# Patient Record
Sex: Female | Born: 1950 | Race: White | Hispanic: No | Marital: Single | Smoking: Former smoker
Health system: Southern US, Community
[De-identification: ages and names within clinical notes are randomized; demographics above are authoritative.]

## PROBLEM LIST (undated history)

## (undated) DIAGNOSIS — E785 Hyperlipidemia, unspecified: Secondary | ICD-10-CM

## (undated) DIAGNOSIS — F32A Depression, unspecified: Secondary | ICD-10-CM

## (undated) DIAGNOSIS — I639 Cerebral infarction, unspecified: Secondary | ICD-10-CM

## (undated) DIAGNOSIS — I10 Essential (primary) hypertension: Secondary | ICD-10-CM

## (undated) DIAGNOSIS — F0150 Vascular dementia without behavioral disturbance: Secondary | ICD-10-CM

## (undated) DIAGNOSIS — F329 Major depressive disorder, single episode, unspecified: Secondary | ICD-10-CM

## (undated) DIAGNOSIS — H3530 Unspecified macular degeneration: Secondary | ICD-10-CM

---

## 2009-12-22 ENCOUNTER — Emergency Department: Payer: Self-pay | Admitting: Emergency Medicine

## 2010-02-20 ENCOUNTER — Emergency Department: Payer: Self-pay

## 2010-03-30 ENCOUNTER — Ambulatory Visit: Payer: Self-pay | Admitting: Psychiatry

## 2010-09-21 ENCOUNTER — Emergency Department: Payer: Self-pay | Admitting: Emergency Medicine

## 2012-01-03 ENCOUNTER — Emergency Department: Payer: Self-pay | Admitting: Emergency Medicine

## 2012-01-03 LAB — URINALYSIS, COMPLETE
Blood: NEGATIVE
Ketone: NEGATIVE
Nitrite: POSITIVE
Ph: 6 (ref 4.5–8.0)
Protein: NEGATIVE
Specific Gravity: 1.017 (ref 1.003–1.030)
Squamous Epithelial: 6

## 2015-08-18 ENCOUNTER — Encounter: Payer: Self-pay | Admitting: Emergency Medicine

## 2015-08-18 ENCOUNTER — Emergency Department: Payer: Medicare Other

## 2015-08-18 ENCOUNTER — Emergency Department
Admission: EM | Admit: 2015-08-18 | Discharge: 2015-08-18 | Disposition: A | Discharge status: Home / Self Care | Payer: Medicare Other | Attending: Emergency Medicine | Admitting: Emergency Medicine

## 2015-08-18 DIAGNOSIS — Z7982 Long term (current) use of aspirin: Secondary | ICD-10-CM | POA: Insufficient documentation

## 2015-08-18 DIAGNOSIS — N179 Acute kidney failure, unspecified: Secondary | ICD-10-CM | POA: Diagnosis not present

## 2015-08-18 DIAGNOSIS — Z720 Tobacco use: Secondary | ICD-10-CM | POA: Diagnosis not present

## 2015-08-18 DIAGNOSIS — N390 Urinary tract infection, site not specified: Secondary | ICD-10-CM | POA: Diagnosis not present

## 2015-08-18 DIAGNOSIS — Z79899 Other long term (current) drug therapy: Secondary | ICD-10-CM | POA: Insufficient documentation

## 2015-08-18 DIAGNOSIS — M546 Pain in thoracic spine: Secondary | ICD-10-CM | POA: Diagnosis present

## 2015-08-18 LAB — CBC WITH DIFFERENTIAL/PLATELET
BASOS ABS: 0 10*3/uL (ref 0–0.1)
BASOS PCT: 0 %
EOS ABS: 0 10*3/uL (ref 0–0.7)
EOS PCT: 0 %
HCT: 41.2 % (ref 35.0–47.0)
Hemoglobin: 13.3 g/dL (ref 12.0–16.0)
Lymphocytes Relative: 6 %
Lymphs Abs: 0.8 10*3/uL — ABNORMAL LOW (ref 1.0–3.6)
MCH: 26.9 pg (ref 26.0–34.0)
MCHC: 32.2 g/dL (ref 32.0–36.0)
MCV: 83.5 fL (ref 80.0–100.0)
MONO ABS: 0.3 10*3/uL (ref 0.2–0.9)
Monocytes Relative: 2 %
Neutro Abs: 11 10*3/uL — ABNORMAL HIGH (ref 1.4–6.5)
Neutrophils Relative %: 92 %
Platelets: 226 10*3/uL (ref 150–440)
RBC: 4.93 MIL/uL (ref 3.80–5.20)
RDW: 14.6 % — ABNORMAL HIGH (ref 11.5–14.5)
WBC: 12.1 10*3/uL — AB (ref 3.6–11.0)

## 2015-08-18 LAB — URINALYSIS COMPLETE WITH MICROSCOPIC (ARMC ONLY)
BILIRUBIN URINE: NEGATIVE
Bacteria, UA: NONE SEEN
Glucose, UA: NEGATIVE mg/dL
HGB URINE DIPSTICK: NEGATIVE
KETONES UR: NEGATIVE mg/dL
NITRITE: POSITIVE — AB
PH: 7 (ref 5.0–8.0)
PROTEIN: NEGATIVE mg/dL
SPECIFIC GRAVITY, URINE: 1.013 (ref 1.005–1.030)

## 2015-08-18 LAB — BASIC METABOLIC PANEL
Anion gap: 13 (ref 5–15)
BUN: 21 mg/dL — AB (ref 6–20)
CALCIUM: 10.4 mg/dL — AB (ref 8.9–10.3)
CO2: 25 mmol/L (ref 22–32)
Chloride: 101 mmol/L (ref 101–111)
Creatinine, Ser: 1.43 mg/dL — ABNORMAL HIGH (ref 0.44–1.00)
GFR calc Af Amer: 43 mL/min — ABNORMAL LOW (ref >60)
GFR, EST NON AFRICAN AMERICAN: 38 mL/min — AB (ref >60)
GLUCOSE: 123 mg/dL — AB (ref 65–99)
Potassium: 3.9 mmol/L (ref 3.5–5.1)
SODIUM: 139 mmol/L (ref 135–145)

## 2015-08-18 MED ORDER — LEVOFLOXACIN IN D5W 750 MG/150ML IV SOLN
750.0000 mg | Freq: Once | INTRAVENOUS | Status: AC
  Administered 2015-08-18: 750 mg via INTRAVENOUS
  Filled 2015-08-18: qty 150

## 2015-08-18 MED ORDER — ONDANSETRON HCL 4 MG/2ML IJ SOLN
4.0000 mg | Freq: Once | INTRAMUSCULAR | Status: AC
  Administered 2015-08-18: 4 mg via INTRAVENOUS
  Filled 2015-08-18: qty 2

## 2015-08-18 MED ORDER — DEXTROSE 5 % IV SOLN
1.0000 g | Freq: Once | INTRAVENOUS | Status: DC
  Filled 2015-08-18: qty 10

## 2015-08-18 MED ORDER — SODIUM CHLORIDE 0.9 % IV BOLUS (SEPSIS)
1000.0000 mL | Freq: Once | INTRAVENOUS | Status: AC
  Administered 2015-08-18: 1000 mL via INTRAVENOUS

## 2015-08-18 MED ORDER — LEVOFLOXACIN 750 MG PO TABS: 750.0000 mg | ORAL_TABLET | Freq: Every day | ORAL | Status: DC

## 2015-08-18 MED ORDER — ACETAMINOPHEN 325 MG PO TABS
650.0000 mg | ORAL_TABLET | Freq: Once | ORAL | Status: AC
  Administered 2015-08-18: 650 mg via ORAL
  Filled 2015-08-18: qty 2

## 2015-08-18 MED ORDER — MORPHINE SULFATE (PF) 4 MG/ML IV SOLN
4.0000 mg | Freq: Once | INTRAVENOUS | Status: AC
  Administered 2015-08-18: 4 mg via INTRAVENOUS
  Filled 2015-08-18: qty 1

## 2015-08-18 MED ORDER — SODIUM CHLORIDE 0.9 % IV BOLUS (SEPSIS): 1000.0000 mL | Freq: Once | INTRAVENOUS | Status: DC

## 2015-08-18 NOTE — Discharge Instructions (Signed)
You were evaluated for back pain and being treated for urinary tract infection and possible early pneumonia with antibiotic called Levaquin. Return to the emergency room for any worsening condition including worsening pain, any trouble breathing or chest pain, any abdominal pain, any urinary problems, any fever, or any condition concerning to you.  You were given IV fluids for mild acute kidney failure suspected to be due to dehydration.   Acute Kidney Injury Acute kidney injury is any condition in which there is sudden (acute) damage to the kidneys. Acute kidney injury was previously known as acute kidney failure or acute renal failure. The kidneys are two organs that lie on either side of the spine between the middle of the back and the front of the abdomen. The kidneys:  Remove wastes and extra water from the blood.   Produce important hormones. These help keep bones strong, regulate blood pressure, and help create red blood cells.   Balance the fluids and chemicals in the blood and tissues. A small amount of kidney damage may not cause problems, but a large amount of damage may make it difficult or impossible for the kidneys to work the way they should. Acute kidney injury may develop into long-lasting (chronic) kidney disease. It may also develop into a life-threatening disease called end-stage kidney disease. Acute kidney injury can get worse very quickly, so it should be treated right away. Early treatment may prevent other kidney diseases from developing. CAUSES   A problem with blood flow to the kidneys. This may be caused by:   Blood loss.   Heart disease.   Severe burns.   Liver disease.  Direct damage to the kidneys. This may be caused by:  Some medicines.   A kidney infection.   Poisoning or consuming toxic substances.   A surgical wound.   A blow to the kidney area.   A problem with urine flow. This may be caused by:   Cancer.   Kidney stones.    An enlarged prostate. SIGNS AND SYMPTOMS   Swelling (edema) of the legs, ankles, or feet.   Tiredness (lethargy).   Nausea or vomiting.   Confusion.   Problems with urination, such as:   Painful or burning feeling during urination.   Decreased urine production.   Frequent accidents in children who are potty trained.   Bloody urine.   Muscle twitches and cramps.   Shortness of breath.   Seizures.   Chest pain or pressure. Sometimes, no symptoms are present. DIAGNOSIS Acute kidney injury may be detected and diagnosed by tests, including blood, urine, imaging, or kidney biopsy tests.  TREATMENT Treatment of acute kidney injury varies depending on the cause and severity of the kidney damage. In mild cases, no treatment may be needed. The kidneys may heal on their own. If acute kidney injury is more severe, your health care provider will treat the cause of the kidney damage, help the kidneys heal, and prevent complications from occurring. Severe cases may require a procedure to remove toxic wastes from the body (dialysis) or surgery to repair kidney damage. Surgery may involve:   Repair of a torn kidney.   Removal of an obstruction. HOME CARE INSTRUCTIONS  Follow your prescribed diet.  Take medicines only as directed by your health care provider.  Do not take any new medicines (prescription, over-the-counter, or nutritional supplements) unless approved by your health care provider. Many medicines can worsen your kidney damage or may need to have the dose adjusted.  Keep all follow-up visits as directed by your health care provider. This is important.  Observe your condition to make sure you are healing as expected. SEEK IMMEDIATE MEDICAL CARE IF:  You are feeling ill or have severe pain in the back or side.   Your symptoms return or you have new symptoms.  You have any symptoms of end-stage kidney disease. These include:   Persistent  itchiness.   Loss of appetite.   Headaches.   Abnormally dark or light skin.  Numbness in the hands or feet.   Easy bruising.   Frequent hiccups.   Menstruation stops.   You have a fever.  You have increased urine production.  You have pain or bleeding when urinating. MAKE SURE YOU:   Understand these instructions.  Will watch your condition.  Will get help right away if you are not doing well or get worse.   This information is not intended to replace advice given to you by your health care provider. Make sure you discuss any questions you have with your health care provider.   Document Released: 04/17/2011 Document Revised: 10/23/2014 Document Reviewed: 05/31/2012 Elsevier Interactive Patient Education 2016 [employer]  Urinary Tract Infection A urinary tract infection (UTI) can occur any place along the urinary tract. The tract includes the kidneys, ureters, bladder, and urethra. A type of germ called bacteria often causes a UTI. UTIs are often helped with antibiotic medicine.  HOME CARE   If given, take antibiotics as told by your doctor. Finish them even if you start to feel better.  Drink enough fluids to keep your pee (urine) clear or pale yellow.  Avoid tea, drinks with caffeine, and bubbly (carbonated) drinks.  Pee often. Avoid holding your pee in for a long time.  Pee before and after having sex (intercourse).  Wipe from front to back after you poop (bowel movement) if you are a woman. Use each tissue only once. GET HELP RIGHT AWAY IF:   You have back pain.  You have lower belly (abdominal) pain.  You have chills.  You feel sick to your stomach (nauseous).  You throw up (vomit).  Your burning or discomfort with peeing does not go away.  You have a fever.  Your symptoms are not better in 3 days. MAKE SURE YOU:   Understand these instructions.  Will watch your condition.  Will get help right away if you are not doing well or  get worse.   This information is not intended to replace advice given to you by your health care provider. Make sure you discuss any questions you have with your health care provider.   Document Released: 03/20/2008 Document Revised: 10/23/2014 Document Reviewed: 05/02/2012 Elsevier Interactive Patient Education [employer].

## 2015-08-18 NOTE — ED Provider Notes (Signed)
[facility] Emergency Department Provider Note   ____________________________________________  Time seen: On arrival to ED I have reviewed the triage vital signs and the triage nursing note.  HISTORY  Chief Complaint Back Pain   Historian Patient, somewhat poor historian  HPI Ana Nielsen is a 64 y.o. female who is here complaining of left mid back pain paraspinous. Patient states she has a history of a pinched nerve, but she is unclear where she had a pinched nerve in the past.  No urinary symptoms. No abdominal pain. No chest pain or trouble breathing. No history of recent fevers. Pain is intermittent. Unclear when it started but, possibly today. No new trauma or injury or fall.    History reviewed. No pertinent past medical history. left-sided stroke with left-sided deficits.  There are no active problems to display for this patient.   History reviewed. No pertinent past surgical history.  Current Outpatient Rx  Name  Route  Sig  Dispense  Refill  . levofloxacin (LEVAQUIN) 750 MG tablet   Oral   Take 1 tablet (750 mg total) by mouth daily.   6 tablet   0    metoprolol, Lipitor, Seroquel, Vicodin, milk of magnesia, Allegra, Zantac, Wellbutrin, aspirin  Allergies Review of patient's allergies indicates not on file.  No family history on file.  Social History Social History  Substance Use Topics  . Smoking status: Current Every Day Smoker -- 0.50 packs/day    Types: Cigarettes  . Smokeless tobacco: None  . Alcohol Use: No    Review of Systems  Constitutional: Negative for fever. Eyes: Negative for visual changes. ENT: Negative for sore throat. Cardiovascular: Negative for chest pain. Respiratory: Negative for shortness of breath. Gastrointestinal: Negative for abdominal pain, vomiting and diarrhea. Genitourinary: Negative for dysuria. Musculoskeletal: Negative for extremity pain. Skin: Negative for rash. Neurological: Negative for  headache. 10 point Review of Systems otherwise negative ____________________________________________   PHYSICAL EXAM:  VITAL SIGNS: ED Triage Vitals  Enc Vitals Group     BP 08/18/15 1618 159/86 mmHg     Pulse Rate 08/18/15 1618 84     Resp 08/18/15 1618 16     Temp 08/18/15 1618 97.7 F (36.5 C)     Temp Source 08/18/15 1618 Oral     SpO2 08/18/15 1618 96 %     Weight 08/18/15 1618 169 lb (76.658 kg)     Height 08/18/15 1618 5\' 4"  (1.626 m)     Head Cir --      Peak Flow --      Pain Score 08/18/15 1618 9     Pain Loc --      Pain Edu? --      Excl. in [place]? --      Constitutional: Alert and oriented, but somewhat poor historian. Well appearing and in no distress. Eyes: Conjunctivae are normal. PERRL. Normal extraocular movements. ENT   Head: Normocephalic and atraumatic.   Nose: No congestion/rhinnorhea.   Mouth/Throat: Mucous membranes are moist.   Neck: No stridor. Cardiovascular/Chest: Normal rate, regular rhythm.  No murmurs, rubs, or gallops. Respiratory: Normal respiratory effort without tachypnea nor retractions. Breath sounds are clear and equal bilaterally. No wheezes/rales/rhonchi. Gastrointestinal: Soft. No distention, no guarding, no rebound. Nontender   Genitourinary/rectal:Deferred Musculoskeletal: No point tenderness along the spine nor paraspinous musculature. No palpable muscle spasm on the back. Nontender with normal range of motion in all extremities. No joint effusions.  No lower extremity tenderness.  No edema. Neurologic:  Normal speech and language. No gross or focal neurologic deficits are appreciated. Skin:  Skin is warm, dry and intact. No rash noted.   ____________________________________________   EKG I, Lisa Roca, MD, the attending physician have personally viewed and interpreted all ECGs.  No EKG performed ____________________________________________  LABS (pertinent positives/negatives)  Basic metabolic panel  significant for BUN 21 and creatinine 1.43, glucose 123 White blood count 12.1 with left shift. Hemoglobin 13.3 and platelet count 226 Urinalysis trace leukocytes, positive nitrites, no bacteria seen ____________________________________________  RADIOLOGY All Xrays were viewed by me. Imaging interpreted by Radiologist.  Thoracic spine two-view: No acute osseous abnormality. 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  Mild RIGHT basilar subsegmental atelectasis. __________________________________________  PROCEDURES  Procedure(s) performed: None  Critical Care performed: None  ____________________________________________   ED COURSE / ASSESSMENT AND PLAN  CONSULTATIONS: None  Pertinent labs & imaging results that were available during my care of the patient were reviewed by me and considered in my medical decision making (see chart for details).  The patient came in with a complaint that she was having a pinched nerve in her back, and referring the spot of the pain to the left side thoracic spine, however there is no point tenderness on palpation of the spine or paraspinous musculature. She seemed to be in quite a bit of pain, and was treated initially with Tylenol, and then some morphine IV. Screening laboratory evaluation did show mildly elevated white blood cell count, and mildly elevated BUN and creatinine. Patient was given IV fluids. I'm currently waiting on urinalysis, out of concern that she could be experiencing pain related to possible kidney stone.  Urinalysis is nitrite positive, and so patient will be treated with IV dose of Levaquin, and a urine culture sent. Levaquin was chosen as it would also cover for pulmonary infiltrate,  as was possibly seen on chest x-ray.  Patient continues to holler intermittently, it is unclear whether or not she is in pain, or if she is trying to have someone sit with her. I considered obtaining a CT angiogram of the chest abdomen pelvis to rule out vascular emergency, however on reexamination she is no longer having any pain, and she states she does not want a CAT scan, and her GFR is 38, and so there would be potential injury to the kidney and I don't think it's necessary or worth the risk at this point in time.   I attempted to call the daughter listed on her paperwork, to update her on her mother's hospital stay, at home and cell phone, and there was no answer.  Patient will be discharged back to her facility, on Levaquin for 1 week.   Patient / Family / Caregiver informed of clinical course, medical decision-making process, and agree with plan.   I discussed return precautions, follow-up instructions, and discharged instructions with patient and/or family.  ___________________________________________   FINAL CLINICAL IMPRESSION(S) / ED DIAGNOSES   Final diagnoses:  Urinary tract infection without hematuria, site unspecified  Acute renal failure, unspecified acute renal failure type (HCC)       Lisa Roca, MD 08/18/15 2054

## 2015-08-18 NOTE — ED Notes (Signed)
Report given to [place] at [facility].

## 2015-08-18 NOTE — ED Notes (Signed)
Pt unable to sign e-signature, no family here to sign.

## 2015-08-18 NOTE — ED Notes (Signed)
Pt comes from [facility] facility with mid back pain. Pt has hx of pinched nerve in mid bad. Pt A&O denies any sob.

## 2015-08-18 NOTE — ED Notes (Signed)
Pt pericare and linen changed.

## 2015-08-20 LAB — URINE CULTURE

## 2015-08-21 ENCOUNTER — Telehealth: Payer: Self-pay | Admitting: Pharmacist

## 2015-08-21 NOTE — Telephone Encounter (Signed)
Called [facility] to call in new RX for pt since ux showed resistance to fluroquinolone. Spoke to [facility] and informed her of the need to call in new RX. Requested that we fax over cx report and RX. Faxed over the cx report and the discharge cx report form with Dr. Corky Downs signature and orders for cephalexin 500mg  BID for 7 days and instructions to d/c levaquin. Faxed to [phone]. Call back # of [phone] was left with Maliq. Ramond Dial, Pharm.D Clinical Pharmacist

## 2017-05-14 ENCOUNTER — Emergency Department: Payer: Medicare Other

## 2017-05-14 ENCOUNTER — Inpatient Hospital Stay
Admission: EM | Admit: 2017-05-14 | Discharge: 2017-06-16 | DRG: 870 | Disposition: E | Payer: Medicare Other | Attending: Internal Medicine | Admitting: Internal Medicine

## 2017-05-14 DIAGNOSIS — A419 Sepsis, unspecified organism: Principal | ICD-10-CM | POA: Diagnosis present

## 2017-05-14 DIAGNOSIS — R6520 Severe sepsis without septic shock: Secondary | ICD-10-CM | POA: Diagnosis not present

## 2017-05-14 DIAGNOSIS — Z7982 Long term (current) use of aspirin: Secondary | ICD-10-CM

## 2017-05-14 DIAGNOSIS — J9601 Acute respiratory failure with hypoxia: Secondary | ICD-10-CM | POA: Diagnosis present

## 2017-05-14 DIAGNOSIS — M21372 Foot drop, left foot: Secondary | ICD-10-CM | POA: Diagnosis present

## 2017-05-14 DIAGNOSIS — N179 Acute kidney failure, unspecified: Secondary | ICD-10-CM

## 2017-05-14 DIAGNOSIS — Z87891 Personal history of nicotine dependence: Secondary | ICD-10-CM

## 2017-05-14 DIAGNOSIS — F329 Major depressive disorder, single episode, unspecified: Secondary | ICD-10-CM | POA: Diagnosis present

## 2017-05-14 DIAGNOSIS — E872 Acidosis, unspecified: Secondary | ICD-10-CM

## 2017-05-14 DIAGNOSIS — B9562 Methicillin resistant Staphylococcus aureus infection as the cause of diseases classified elsewhere: Secondary | ICD-10-CM | POA: Diagnosis present

## 2017-05-14 DIAGNOSIS — R739 Hyperglycemia, unspecified: Secondary | ICD-10-CM | POA: Diagnosis present

## 2017-05-14 DIAGNOSIS — R4020 Unspecified coma: Secondary | ICD-10-CM | POA: Diagnosis present

## 2017-05-14 DIAGNOSIS — C7931 Secondary malignant neoplasm of brain: Secondary | ICD-10-CM | POA: Diagnosis present

## 2017-05-14 DIAGNOSIS — R627 Adult failure to thrive: Secondary | ICD-10-CM | POA: Diagnosis not present

## 2017-05-14 DIAGNOSIS — Z4659 Encounter for fitting and adjustment of other gastrointestinal appliance and device: Secondary | ICD-10-CM

## 2017-05-14 DIAGNOSIS — Y95 Nosocomial condition: Secondary | ICD-10-CM | POA: Diagnosis present

## 2017-05-14 DIAGNOSIS — R1310 Dysphagia, unspecified: Secondary | ICD-10-CM | POA: Diagnosis present

## 2017-05-14 DIAGNOSIS — M21371 Foot drop, right foot: Secondary | ICD-10-CM | POA: Diagnosis present

## 2017-05-14 DIAGNOSIS — F0150 Vascular dementia without behavioral disturbance: Secondary | ICD-10-CM | POA: Diagnosis present

## 2017-05-14 DIAGNOSIS — R4182 Altered mental status, unspecified: Secondary | ICD-10-CM

## 2017-05-14 DIAGNOSIS — I69354 Hemiplegia and hemiparesis following cerebral infarction affecting left non-dominant side: Secondary | ICD-10-CM

## 2017-05-14 DIAGNOSIS — I69391 Dysphagia following cerebral infarction: Secondary | ICD-10-CM

## 2017-05-14 DIAGNOSIS — R34 Anuria and oliguria: Secondary | ICD-10-CM | POA: Diagnosis not present

## 2017-05-14 DIAGNOSIS — H3530 Unspecified macular degeneration: Secondary | ICD-10-CM | POA: Diagnosis present

## 2017-05-14 DIAGNOSIS — E860 Dehydration: Secondary | ICD-10-CM | POA: Diagnosis present

## 2017-05-14 DIAGNOSIS — I10 Essential (primary) hypertension: Secondary | ICD-10-CM | POA: Diagnosis present

## 2017-05-14 DIAGNOSIS — R40243 Glasgow coma scale score 3-8, unspecified time: Secondary | ICD-10-CM | POA: Diagnosis not present

## 2017-05-14 DIAGNOSIS — B961 Klebsiella pneumoniae [K. pneumoniae] as the cause of diseases classified elsewhere: Secondary | ICD-10-CM | POA: Diagnosis present

## 2017-05-14 DIAGNOSIS — D332 Benign neoplasm of brain, unspecified: Secondary | ICD-10-CM | POA: Diagnosis not present

## 2017-05-14 DIAGNOSIS — Z79899 Other long term (current) drug therapy: Secondary | ICD-10-CM

## 2017-05-14 DIAGNOSIS — C799 Secondary malignant neoplasm of unspecified site: Secondary | ICD-10-CM

## 2017-05-14 DIAGNOSIS — R5381 Other malaise: Secondary | ICD-10-CM | POA: Diagnosis present

## 2017-05-14 DIAGNOSIS — R6521 Severe sepsis with septic shock: Secondary | ICD-10-CM | POA: Diagnosis present

## 2017-05-14 DIAGNOSIS — J15212 Pneumonia due to Methicillin resistant Staphylococcus aureus: Secondary | ICD-10-CM | POA: Diagnosis not present

## 2017-05-14 DIAGNOSIS — E785 Hyperlipidemia, unspecified: Secondary | ICD-10-CM | POA: Diagnosis present

## 2017-05-14 DIAGNOSIS — J189 Pneumonia, unspecified organism: Secondary | ICD-10-CM | POA: Diagnosis present

## 2017-05-14 DIAGNOSIS — L8990 Pressure ulcer of unspecified site, unspecified stage: Secondary | ICD-10-CM | POA: Insufficient documentation

## 2017-05-14 DIAGNOSIS — C7951 Secondary malignant neoplasm of bone: Secondary | ICD-10-CM | POA: Diagnosis not present

## 2017-05-14 DIAGNOSIS — D696 Thrombocytopenia, unspecified: Secondary | ICD-10-CM | POA: Diagnosis present

## 2017-05-14 DIAGNOSIS — J9690 Respiratory failure, unspecified, unspecified whether with hypoxia or hypercapnia: Secondary | ICD-10-CM | POA: Diagnosis present

## 2017-05-14 DIAGNOSIS — Z22322 Carrier or suspected carrier of Methicillin resistant Staphylococcus aureus: Secondary | ICD-10-CM

## 2017-05-14 DIAGNOSIS — E870 Hyperosmolality and hypernatremia: Secondary | ICD-10-CM | POA: Diagnosis present

## 2017-05-14 DIAGNOSIS — Z515 Encounter for palliative care: Secondary | ICD-10-CM | POA: Diagnosis not present

## 2017-05-14 DIAGNOSIS — Z66 Do not resuscitate: Secondary | ICD-10-CM | POA: Diagnosis present

## 2017-05-14 DIAGNOSIS — J690 Pneumonitis due to inhalation of food and vomit: Secondary | ICD-10-CM

## 2017-05-14 DIAGNOSIS — Z7189 Other specified counseling: Secondary | ICD-10-CM | POA: Diagnosis not present

## 2017-05-14 DIAGNOSIS — N289 Disorder of kidney and ureter, unspecified: Secondary | ICD-10-CM

## 2017-05-14 DIAGNOSIS — L89150 Pressure ulcer of sacral region, unstageable: Secondary | ICD-10-CM | POA: Diagnosis present

## 2017-05-14 DIAGNOSIS — Z7401 Bed confinement status: Secondary | ICD-10-CM

## 2017-05-14 DIAGNOSIS — N19 Unspecified kidney failure: Secondary | ICD-10-CM

## 2017-05-14 HISTORY — DX: Hyperlipidemia, unspecified: E78.5

## 2017-05-14 HISTORY — DX: Unspecified macular degeneration: H35.30

## 2017-05-14 HISTORY — DX: Cerebral infarction, unspecified: I63.9

## 2017-05-14 HISTORY — DX: Major depressive disorder, single episode, unspecified: F32.9

## 2017-05-14 HISTORY — DX: Depression, unspecified: F32.A

## 2017-05-14 HISTORY — DX: Vascular dementia, unspecified severity, without behavioral disturbance, psychotic disturbance, mood disturbance, and anxiety: F01.50

## 2017-05-14 HISTORY — DX: Essential (primary) hypertension: I10

## 2017-05-14 LAB — COMPREHENSIVE METABOLIC PANEL
ALT: 30 U/L (ref 14–54)
ANION GAP: 14 (ref 5–15)
AST: 99 U/L — AB (ref 15–41)
Albumin: 3.3 g/dL — ABNORMAL LOW (ref 3.5–5.0)
Alkaline Phosphatase: 289 U/L — ABNORMAL HIGH (ref 38–126)
BUN: 90 mg/dL — ABNORMAL HIGH (ref 6–20)
CHLORIDE: 117 mmol/L — AB (ref 101–111)
CO2: 29 mmol/L (ref 22–32)
Calcium: 14.4 mg/dL (ref 8.9–10.3)
Creatinine, Ser: 2.41 mg/dL — ABNORMAL HIGH (ref 0.44–1.00)
GFR, EST AFRICAN AMERICAN: 23 mL/min — AB (ref >60)
GFR, EST NON AFRICAN AMERICAN: 20 mL/min — AB (ref >60)
Glucose, Bld: 156 mg/dL — ABNORMAL HIGH (ref 65–99)
POTASSIUM: 4 mmol/L (ref 3.5–5.1)
Sodium: 160 mmol/L — ABNORMAL HIGH (ref 135–145)
Total Bilirubin: 0.9 mg/dL (ref 0.3–1.2)
Total Protein: 7.5 g/dL (ref 6.5–8.1)

## 2017-05-14 LAB — GLUCOSE, CAPILLARY: Glucose-Capillary: 122 mg/dL — ABNORMAL HIGH (ref 65–99)

## 2017-05-14 LAB — BLOOD GAS, ARTERIAL
ACID-BASE DEFICIT: 1.3 mmol/L (ref 0.0–2.0)
Acid-base deficit: 4.5 mmol/L — ABNORMAL HIGH (ref 0.0–2.0)
BICARBONATE: 21.1 mmol/L (ref 20.0–28.0)
Bicarbonate: 25.3 mmol/L (ref 20.0–28.0)
FIO2: 0.7
FIO2: 1
LHR: 16 {breaths}/min
MECHANICAL RATE: 16
MECHVT: 420 mL
O2 SAT: 92.5 %
O2 Saturation: 95.3 %
PATIENT TEMPERATURE: 37
PATIENT TEMPERATURE: 37
PCO2 ART: 48 mmHg (ref 32.0–48.0)
PEEP: 5 cmH2O
PEEP: 10 cmH2O
PH ART: 7.33 — AB (ref 7.350–7.450)
PO2 ART: 70 mmHg — AB (ref 83.0–108.0)
PO2 ART: 83 mmHg (ref 83.0–108.0)
pCO2 arterial: 40 mmHg (ref 32.0–48.0)
pH, Arterial: 7.33 — ABNORMAL LOW (ref 7.350–7.450)

## 2017-05-14 LAB — URINALYSIS, COMPLETE (UACMP) WITH MICROSCOPIC
GLUCOSE, UA: NEGATIVE mg/dL
Ketones, ur: NEGATIVE mg/dL
NITRITE: POSITIVE — AB
PH: 6 (ref 5.0–8.0)
Specific Gravity, Urine: 1.025 (ref 1.005–1.030)

## 2017-05-14 LAB — CBC WITH DIFFERENTIAL/PLATELET
Basophils Absolute: 0.1 10*3/uL (ref 0–0.1)
Basophils Relative: 1 %
Eosinophils Absolute: 0.1 10*3/uL (ref 0–0.7)
Eosinophils Relative: 0 %
HCT: 39 % (ref 35.0–47.0)
HEMOGLOBIN: 12.2 g/dL (ref 12.0–16.0)
LYMPHS ABS: 2 10*3/uL (ref 1.0–3.6)
LYMPHS PCT: 17 %
MCH: 26.1 pg (ref 26.0–34.0)
MCHC: 31.2 g/dL — ABNORMAL LOW (ref 32.0–36.0)
MCV: 83.6 fL (ref 80.0–100.0)
Monocytes Absolute: 0.7 10*3/uL (ref 0.2–0.9)
Monocytes Relative: 6 %
NEUTROS ABS: 9.5 10*3/uL — AB (ref 1.4–6.5)
NEUTROS PCT: 76 %
Platelets: 205 10*3/uL (ref 150–440)
RBC: 4.67 MIL/uL (ref 3.80–5.20)
RDW: 17.7 % — ABNORMAL HIGH (ref 11.5–14.5)
WBC: 12.3 10*3/uL — ABNORMAL HIGH (ref 3.6–11.0)

## 2017-05-14 LAB — LACTIC ACID, PLASMA
Lactic Acid, Venous: 3.2 mmol/L (ref 0.5–1.9)
Lactic Acid, Venous: 1.6 mmol/L (ref 0.5–1.9)

## 2017-05-14 LAB — BRAIN NATRIURETIC PEPTIDE: B NATRIURETIC PEPTIDE 5: 435 pg/mL — AB (ref 0.0–100.0)

## 2017-05-14 LAB — TROPONIN I: TROPONIN I: 0.15 ng/mL — AB (ref <0.03)

## 2017-05-14 LAB — LIPASE, BLOOD: Lipase: 192 U/L — ABNORMAL HIGH (ref 11–51)

## 2017-05-14 LAB — MRSA PCR SCREENING: MRSA by PCR: POSITIVE — AB

## 2017-05-14 MED ORDER — FAMOTIDINE IN NACL 20-0.9 MG/50ML-% IV SOLN
20.0000 mg | INTRAVENOUS | Status: DC
  Administered 2017-05-14: 20 mg via INTRAVENOUS
  Filled 2017-05-14: qty 50

## 2017-05-14 MED ORDER — MUPIROCIN 2 % EX OINT
1.0000 "application " | TOPICAL_OINTMENT | Freq: Two times a day (BID) | CUTANEOUS | Status: AC
  Administered 2017-05-14 – 2017-05-18 (×10): 1 via NASAL
  Filled 2017-05-14: qty 22

## 2017-05-14 MED ORDER — DOPAMINE-DEXTROSE 3.2-5 MG/ML-% IV SOLN: 2.0000 ug/kg/min | Freq: Once | INTRAVENOUS | Status: DC

## 2017-05-14 MED ORDER — ACETAMINOPHEN 325 MG PO TABS
650.0000 mg | ORAL_TABLET | Freq: Four times a day (QID) | ORAL | Status: DC | PRN
Start: 2017-05-14 — End: 2017-05-23

## 2017-05-14 MED ORDER — HEPARIN SODIUM (PORCINE) 5000 UNIT/ML IJ SOLN
5000.0000 [IU] | Freq: Three times a day (TID) | INTRAMUSCULAR | Status: DC
  Administered 2017-05-14 – 2017-05-15 (×3): 5000 [IU] via SUBCUTANEOUS
  Filled 2017-05-14 (×4): qty 1

## 2017-05-14 MED ORDER — VANCOMYCIN HCL IN DEXTROSE 1-5 GM/200ML-% IV SOLN
1000.0000 mg | Freq: Once | INTRAVENOUS | Status: AC
  Administered 2017-05-14: 1000 mg via INTRAVENOUS
  Filled 2017-05-14: qty 200

## 2017-05-14 MED ORDER — ACETAMINOPHEN 650 MG RE SUPP: 650.0000 mg | Freq: Four times a day (QID) | RECTAL | Status: DC | PRN

## 2017-05-14 MED ORDER — PIPERACILLIN-TAZOBACTAM 3.375 G IVPB 30 MIN
3.3750 g | Freq: Once | INTRAVENOUS | Status: AC
  Administered 2017-05-14: 3.375 g via INTRAVENOUS

## 2017-05-14 MED ORDER — SODIUM CHLORIDE 0.9 % IV SOLN: 25.0000 ug/h | INTRAVENOUS | Status: DC

## 2017-05-14 MED ORDER — ORAL CARE MOUTH RINSE
15.0000 mL | OROMUCOSAL | Status: DC
  Administered 2017-05-14 – 2017-05-20 (×59): 15 mL via OROMUCOSAL

## 2017-05-14 MED ORDER — ACETAMINOPHEN 325 MG PO TABS: 650.0000 mg | ORAL_TABLET | Freq: Four times a day (QID) | ORAL | Status: DC | PRN

## 2017-05-14 MED ORDER — SODIUM CHLORIDE 0.9 % IV BOLUS (SEPSIS)
1000.0000 mL | Freq: Once | INTRAVENOUS | Status: AC
  Administered 2017-05-14: 1000 mL via INTRAVENOUS

## 2017-05-14 MED ORDER — ONDANSETRON HCL 4 MG/2ML IJ SOLN: 4.0000 mg | Freq: Four times a day (QID) | INTRAMUSCULAR | Status: DC | PRN

## 2017-05-14 MED ORDER — DEXTROSE 5 % IV SOLN
1.0000 g | INTRAVENOUS | Status: DC
  Administered 2017-05-14: 1 g via INTRAVENOUS
  Filled 2017-05-14: qty 1

## 2017-05-14 MED ORDER — CHLORHEXIDINE GLUCONATE 0.12% ORAL RINSE (MEDLINE KIT)
15.0000 mL | Freq: Two times a day (BID) | OROMUCOSAL | Status: DC
  Administered 2017-05-14 – 2017-05-22 (×18): 15 mL via OROMUCOSAL

## 2017-05-14 MED ORDER — DEXTROSE 5 % IV SOLN
INTRAVENOUS | Status: DC
  Administered 2017-05-14 – 2017-05-16 (×3): via INTRAVENOUS

## 2017-05-14 MED ORDER — CALCITONIN (SALMON) 200 UNIT/ML IJ SOLN
4.0000 [IU]/kg | Freq: Two times a day (BID) | INTRAMUSCULAR | Status: AC
Start: 2017-05-14 — End: 2017-05-15
  Administered 2017-05-14 – 2017-05-15 (×4): 104 [IU] via INTRAMUSCULAR
  Filled 2017-05-14 (×4): qty 0.52

## 2017-05-14 MED ORDER — PIPERACILLIN-TAZOBACTAM 3.375 G IVPB: 3.3750 g | Freq: Two times a day (BID) | INTRAVENOUS | Status: DC

## 2017-05-14 MED ORDER — DOPAMINE-DEXTROSE 3.2-5 MG/ML-% IV SOLN
INTRAVENOUS | Status: AC
  Filled 2017-05-14: qty 250

## 2017-05-14 MED ORDER — ROCURONIUM BROMIDE 50 MG/5ML IV SOLN
1.0000 mg/kg | Freq: Once | INTRAVENOUS | Status: DC
  Administered 2017-05-14: 05:00:00 via INTRAVENOUS

## 2017-05-14 MED ORDER — CHLORHEXIDINE GLUCONATE CLOTH 2 % EX PADS
6.0000 | MEDICATED_PAD | Freq: Every day | CUTANEOUS | Status: AC
  Administered 2017-05-14 – 2017-05-18 (×5): 6 via TOPICAL

## 2017-05-14 MED ORDER — VANCOMYCIN HCL 500 MG IV SOLR
500.0000 mg | Freq: Once | INTRAVENOUS | Status: AC
  Administered 2017-05-14: 500 mg via INTRAVENOUS
  Filled 2017-05-14: qty 500

## 2017-05-14 MED ORDER — ETOMIDATE 2 MG/ML IV SOLN
15.0000 mg | Freq: Once | INTRAVENOUS | Status: AC
  Administered 2017-05-14: 15 mg via INTRAVENOUS

## 2017-05-14 MED ORDER — SODIUM CHLORIDE 0.9 % IV SOLN: INTRAVENOUS | Status: DC

## 2017-05-14 MED ORDER — FENTANYL 2500MCG IN NS 250ML (10MCG/ML) PREMIX INFUSION
0.0000 ug/h | INTRAVENOUS | Status: DC
  Administered 2017-05-14: 25 ug/h via INTRAVENOUS
  Administered 2017-05-15: 100 ug/h via INTRAVENOUS
  Administered 2017-05-16 – 2017-05-17 (×2): 150 ug/h via INTRAVENOUS
  Administered 2017-05-18: 100 ug/h via INTRAVENOUS
  Administered 2017-05-19: 150 ug/h via INTRAVENOUS
  Filled 2017-05-14 (×8): qty 250

## 2017-05-14 MED ORDER — ONDANSETRON HCL 4 MG PO TABS: 4.0000 mg | ORAL_TABLET | Freq: Four times a day (QID) | ORAL | Status: DC | PRN
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
## 2017-05-14 NOTE — Progress Notes (Signed)
Called to er for pt sats in high 60's. Changed pt to 100% and 10 of peep. Sats increased to mid 70's. Pt taken off vent and bagged with 10 of peep at 0558. Pts sats increased to low 80s. Sats decreased to high 60's again. Sat probe change to different hand. Pt sats in mid 77s. Placed pt back on vent and sats in low 90's. Pt sats continued to increased to 100%.

## 2017-05-14 NOTE — Progress Notes (Signed)
Pharmacy Antibiotic Note  Ana Nielsen is a 66 y.o. female with a h/o CVA admitted on 05/03/2017 with acute respiratory failure.  Pharmacy has been consulted for ceftazidime and vancomycin dosing. MRSA PCR is positive. Patient is in ARF with unknown baseline SCR.   Plan: Vancomycin 1000 mg iv once given. Will give an additional vancomycin 500 mg iv and check a random level in 24 hours. Goal trough of 15-20 mcg/ml.   Ceftazidime 1 g iv q 24 hours.   Height: 5\' 4"  (162.6 cm) Weight: 152 lb 1.9 oz (69 kg) IBW/kg (Calculated) : 54.7  Temp (24hrs), Avg:98 F (36.7 C), Min:94.7 F (34.8 C), Max:98.9 F (37.2 C)   Recent Labs Lab 05/01/2017 0450 05/03/2017 0538 04/24/2017 0901  WBC 12.3*  --   --   CREATININE 2.41*  --   --   LATICACIDVEN  --  3.2* 1.6    Estimated Creatinine Clearance: 21.6 mL/min (A) (by C-G formula based on SCr of 2.41 mg/dL (H)).    No Known Allergies  Antimicrobials this admission: Zosyn 7/30 x 1 vancomycin 7/30 >>  Ceftazidime 7/30 >>  Dose adjustments this admission:   Microbiology results: 7/30 BCx: NGTD 7/30 Sputum: sent  7/30 MRSA PCR: positive  Thank you for allowing pharmacy to be a part of this patient's care.  Napoleon Form 05/15/2017 12:39 PM

## 2017-05-14 NOTE — ED Provider Notes (Signed)
[facility] Emergency Department Provider Note   ____________________________________________   First MD Initiated Contact with Patient 04/15/2017 [phone]     (approximate)  I have reviewed the triage vital signs and the nursing notes.   HISTORY  Chief Complaint Respiratory Distress  Patient Unresponsive and Unable to Answer Questions  HPI Ana Nielsen is a 66 y.o. female who was brought in by ambulance with respiratory distress from [facility] which is her nursing facility. According to EMS the patient was found with oxygen levels in the 60s but they are unable to tell us when the patient was last seen normal, the patient's baseline mental status or any other recent history on the patient. They report that the nurse who was with the patient was unable to disclose any information on the patient as she did not know any information. They did not know if she was febrile had complained of any pain or any vomiting. She was brought into the hospital on CPAP with oxygen saturations in the low 70s. The patient was minimally responsive and moving her right arm non-purposefully.   Past Medical History:  Diagnosis Date  . Depression   . Hyperlipidemia   . Hypertension   . Macular degeneration   . Stroke [facility])    left-sided hemiparesis  . Vascular dementia     Patient Active Problem List   Diagnosis Date Noted  . Respiratory failure ([place]) 05/12/2017    History reviewed. No pertinent surgical history.  Prior to Admission medications   Medication Sig Start Date End Date Taking? Authorizing Provider  aspirin EC 81 MG tablet Take 81 mg by mouth daily.   Yes [provider]  atorvastatin (LIPITOR) 20 MG tablet Take 20 mg by mouth daily.   Yes [provider]  carbamazepine (CARBATROL) 300 MG 12 hr capsule Take 300 mg by mouth 2 (two) times daily.   Yes [provider]  CRANBERRY-VITAMIN C-INULIN PO Take 30 mLs by mouth daily.   Yes  [provider]  docusate sodium (COLACE) 100 MG capsule Take 100 mg by mouth 2 (two) times daily.   Yes [provider]  escitalopram (LEXAPRO) 20 MG tablet Take 20 mg by mouth daily.   Yes [provider]  fexofenadine (ALLEGRA ALLERGY) 60 MG tablet Take 60 mg by mouth daily as needed for allergies or rhinitis.   Yes [provider]  hydrochlorothiazide (HYDRODIURIL) 12.5 MG tablet Take 12.5 mg by mouth daily.   Yes [provider]  HYDROcodone-acetaminophen (NORCO/VICODIN) 5-325 MG tablet Take 1 tablet by mouth every 8 (eight) hours as needed for moderate pain.   Yes [provider]  magnesium hydroxide (MILK OF MAGNESIA) 400 MG/5ML suspension Take 30 mLs by mouth every 12 (twelve) hours as needed for mild constipation.   Yes [provider]  metoprolol tartrate (LOPRESSOR) 25 MG tablet Take 25 mg by mouth 2 (two) times daily.   Yes [provider]  Multiple Vitamin (MULTIVITAMIN) capsule Take 1 capsule by mouth daily.   Yes [provider]  polyethylene glycol (MIRALAX / GLYCOLAX) packet Take 17 g by mouth every 12 (twelve) hours as needed for mild constipation, moderate constipation or severe constipation.   Yes [provider]  QUEtiapine (SEROQUEL) 100 MG tablet Take 150 mg by mouth 2 (two) times daily.   Yes [provider]  ranitidine (ZANTAC) 150 MG tablet Take 150 mg by mouth at bedtime.   Yes [provider]  Allergies Patient has no known allergies.  No family history on file.  Social History Social History  Substance Use Topics  . Smoking status: Former Smoker    Packs/day: 0.50    Types: Cigarettes  . Smokeless tobacco: Not on file  . Alcohol use No    Review of Systems  Unable to assess due to patient unresponsiveness and lack of information from nursing home.  ____________________________________________   PHYSICAL EXAM:  VITAL SIGNS: ED Triage Vitals    Enc Vitals Group     BP 05/03/2017 0452 (!) 125/102     Pulse Rate 05/08/2017 0452 (!) 138     Resp 04/22/2017 0452 (!) 47     Temp 05/05/2017 0525 (!) 94.7 F (34.8 C)     Temp Source 04/21/2017 0750 Core     SpO2 04/18/2017 0452 (!) 64 %     Weight 04/18/2017 0452 56 lb 12.8 oz (25.8 kg)     Height 05/11/2017 0739 5\' 4"  (1.626 m)     Head Circumference --      Peak Flow --      Pain Score --      Pain Loc --      Pain Edu? --      Excl. in [place]? --     Constitutional:Patient unresponsive Eyes: Conjunctivae are normal. PERRL. EOMI. Head: Atraumatic. Nose: No congestion/rhinnorhea. Mouth/Throat: Mucous membranes dry and caked with 10 appearing thick liquid substance Cardiovascular: Tachycardia, regular rhythm. Grossly normal heart sounds.  Good peripheral circulation. Respiratory: Tachypnea with increased respiratory effort.  No retractions. Coarse wet breath sounds throughout all lung fields Gastrointestinal: Soft and nontender. No distention. Positive bowel sounds Musculoskeletal: No lower extremity tenderness nor edema.   Neurologic: Patient nonresponsive. Moving right arm up and down but does not speak or make eye contact. Eyes closed but does not follow commands. Left sided spastic hemiparesis. Skin:  Skin is warm, dry and intact.  Psychiatric: Patient unresponsive..  ____________________________________________   LABS (all labs ordered are listed, but only abnormal results are displayed)  Labs Reviewed  COMPREHENSIVE METABOLIC PANEL - Abnormal; Notable for the following:       Result Value   Sodium 160 (*)    Chloride 117 (*)    Glucose, Bld 156 (*)    BUN 90 (*)    Creatinine, Ser 2.41 (*)    Calcium 14.4 (*)    Albumin 3.3 (*)    AST 99 (*)    Alkaline Phosphatase 289 (*)    GFR calc non Af Amer 20 (*)    GFR calc Af Amer 23 (*)    All other components within normal limits  CBC WITH DIFFERENTIAL/PLATELET - Abnormal; Notable for the following:    WBC 12.3 (*)    MCHC 31.2  (*)    RDW 17.7 (*)    Neutro Abs 9.5 (*)    All other components within normal limits  TROPONIN I - Abnormal; Notable for the following:    Troponin I 0.15 (*)    All other components within normal limits  LACTIC ACID, PLASMA - Abnormal; Notable for the following:    Lactic Acid, Venous 3.2 (*)    All other components within normal limits  URINALYSIS, COMPLETE (UACMP) WITH MICROSCOPIC - Abnormal; Notable for the following:    APPearance HAZY (*)    Hgb urine dipstick MODERATE (*)    Bilirubin Urine SMALL (*)    Protein, ur >300 (*)    Nitrite POSITIVE (*)  Leukocytes, UA MODERATE (*)    Squamous Epithelial / LPF 0-5 (*)    Non Squamous Epithelial PRESENT (*)    Bacteria, UA FEW (*)    All other components within normal limits  BLOOD GAS, ARTERIAL - Abnormal; Notable for the following:    pH, Arterial 7.33 (*)    pO2, Arterial 70 (*)    All other components within normal limits  BLOOD GAS, ARTERIAL - Abnormal; Notable for the following:    pH, Arterial 7.33 (*)    Acid-base deficit 4.5 (*)    All other components within normal limits  LIPASE, BLOOD - Abnormal; Notable for the following:    Lipase 192 (*)    All other components within normal limits  BRAIN NATRIURETIC PEPTIDE - Abnormal; Notable for the following:    B Natriuretic Peptide 435.0 (*)    All other components within normal limits  CULTURE, BLOOD (ROUTINE X 2)  CULTURE, BLOOD (ROUTINE X 2)  MRSA PCR SCREENING  LACTIC ACID, PLASMA  BASIC METABOLIC PANEL  CBC   ____________________________________________  EKG  ED ECG REPORT I, Loney Hering, the attending physician, personally viewed and interpreted this ECG.   Date: 05/13/2017  EKG Time: 450  Rate: 139  Rhythm: sinus tachycardia, RBBB  Axis: left axis deviation  Intervals:right bundle branch block  ST&T Change: ST depressions in lead V3, V4, V5, V6  ____________________________________________  RADIOLOGY  Ct Head Wo Contrast  Result  Date: 05/07/2017 CLINICAL DATA:  Unresponsive.  Hypoxia. EXAM: CT HEAD WITHOUT CONTRAST TECHNIQUE: Contiguous axial images were obtained from the base of the skull through the vertex without intravenous contrast. COMPARISON:  09/21/2010 FINDINGS: Brain: No evidence of acute infarction, hemorrhage, hydrocephalus, extra-axial collection or mass effect. New abnormal soft tissue density in the sella measuring up to 14 mm. Advanced chronic microvascular ischemia with low-density in the deep cerebral white matter and remote lacunar infarcts in the deep gray nuclei and deep white matter tracts. Generalized cerebral atrophy. Vascular: Atherosclerotic calcification. Skull: Multiple new lytic lesions, most notably in the left posterior mastoid and squamosal temporal bone, extending into the parietal bone. There is also a large lesion at the greater wing right sphenoid bone, with adjacent dural thickening in the middle cranial fossa. Sinuses/Orbits: Nasopharyngeal fluid in the setting of intubation. 2 cm long mass in the lateral left orbit, with proptosis and mass effect on the optic nerve. IMPRESSION: 1. No acute intracranial finding. 2. 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 3. 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 4. 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 5. Advanced chronic microvascular ischemic injury that has progressed from 2011. Electronically Signed   By: Monte Fantasia M.D.   On: 05/08/2017 07:04   Dg Chest Portable 1 View  Result Date: 05/10/2017 CLINICAL DATA:  Acute onset of hypoxia.  Intubation. EXAM: PORTABLE CHEST 1 VIEW COMPARISON:  04/19/2017 FINDINGS: Endotracheal tube tip measures 3.6 cm above the carina. Enteric tube tip is off field of view but below the left hemidiaphragm. Improved inspiratory effort since previous study. Mild atelectasis or infiltration again demonstrated in the left lung base. No  blunting of costophrenic angles. No pneumothorax. Calcification of the aorta. IMPRESSION: Improved inspiration since previous study. Persistent infiltration or atelectasis in the left lung base. Appliances appear in satisfactory position. Electronically Signed   By: Lucienne Capers M.D.   On: 04/16/2017 06:35   Dg Chest Portable 1 View  Result Date: 05/13/2017 CLINICAL DATA:  Respiratory distress.  Tube placement. EXAM: PORTABLE CHEST 1 VIEW COMPARISON:  08/18/2015 FINDINGS: Endotracheal tube with tip measuring 3.8 cm above the carina. Enteric tube tip is off the field of view but below the left hemidiaphragm. Shallow inspiration with elevation left hemidiaphragm. Heart size and pulmonary vascularity are normal. Interstitial changes in the lungs likely representing fibrosis and bronchitic changes. Possible superimposed infiltration in the left lung base may represent pneumonia or atelectasis. Calcified and tortuous aorta. No pneumothorax. No pleural effusions. IMPRESSION: Appliances appear in satisfactory position. Shallow inspiration with elevation of left hemidiaphragm and atelectasis or infiltration in the left lung base. Chronic bronchitic changes and fibrosis. Electronically Signed   By: Lucienne Capers M.D.   On: 05/09/2017 05:35    ____________________________________________   PROCEDURES  Procedure(s) performed: please, see procedure note(s).  Procedure Name: Intubation Date/Time: 04/15/2017 4:50 AM Performed by: Loney Hering Pre-anesthesia Checklist: Patient identified Oxygen Delivery Method: Ambu bag Preoxygenation: Pre-oxygenation with 100% oxygen Induction Type: Rapid sequence Ventilation: Two handed mask ventilation required Laryngoscope Size: Glidescope and 4 Tube size: 7.5 mm Number of attempts: 2 Placement Confirmation: ETT inserted through vocal cords under direct vision,  Positive ETCO2 and Breath sounds checked- equal and bilateral Secured at: 21 cm Tube secured  with: ETT holder Difficulty Due To: Difficulty was unanticipated Comments: After the initial intubation the cuff was inflated above the vocal cords. We then reinserted the glide scope and ensured that the ET tube cuff was inserted past the vocal cords.       Critical Care performed: Yes, see critical care note(s)  CRITICAL CARE Performed by: Charlesetta Ivory P   Total critical care time: 75 minutes  Critical care time was exclusive of separately billable procedures and treating other patients.  Critical care was necessary to treat or prevent imminent or life-threatening deterioration.  Critical care was time spent personally by me on the following activities: development of treatment plan with patient and/or surrogate as well as nursing, discussions with consultants, evaluation of patient's response to treatment, examination of patient, obtaining history from patient or surrogate, ordering and performing treatments and interventions, ordering and review of laboratory studies, ordering and review of radiographic studies, pulse oximetry and re-evaluation of patient's condition.  ____________________________________________   INITIAL IMPRESSION / ASSESSMENT AND PLAN / ED COURSE  Pertinent labs & imaging results that were available during my care of the patient were reviewed by me and considered in my medical decision making (see chart for details).  This is a 66 year old female who came from her nursing home with respiratory distress. When the patient arrived she was unresponsive and hypoxic. The decision was made immediately to intubate the patient. We initially placed her on BiPAP as we were getting medications and equipment ready for intubation. The patient did have some low O2 sats and was unable to have O2 sats above 70s prior to intubation. When I intubated the patient there was some thick hand secretions coming from her airway which was suctioned. Once we repositioned the tube we  did give the patient 2 L of normal saline immediately. Her blood pressure was initially low but we did get some improvement with fluids. We decided to call a code sepsis on the patient as she was tachycardic and hypotensive with altered mental status. It also appears that the patient may have aspirated and has some thick secretions coming from her airway. After intubation a small amount of fluid came from the patient's catheter  placement and it appears that the patient has some severe dehydration. She has some acute renal insufficiency as well as hypernatremia and hypercalcemia. The patient did have an episode where she dropped her oxygen saturations into the 60s and we were forced to increase the patient's PEEP to 10 and bagged her until her sats improved. We repeated the ABG as well as the chest x-ray but her sats improved. The patient's blood pressure did also decrease with the oxygen saturation but it improved again with the oxygen. We were going to start the patient on some dopamine but as her blood pressure improved we gave her a third liter of normal saline and decided to monitor her pressure. We performed a CT scan of the patient's head which did not show any acute intracranial hemorrhage. The patient did receive a dose of calcitonin for her hypercalcemia and she will be admitted to the intensive care unit. I contacted the hospitalist for admission.      ED Sepsis - Repeat Assessment   Performed at:    0600  Last Vitals:    Blood pressure 92/66, pulse (!) 108, temperature 97.7 F (36.5 C), temperature source Core (Comment), resp. rate (!) 22, height 5\' 4"  (1.626 m), weight 69 kg (152 lb 1.9 oz), SpO2 96 %.  Heart:                  Tachycardia with no murmurs rubs or gallops  Lungs:     Coarse breath sounds with ventilator  Capillary Refill:   <2 sec  Peripheral Pulse (include location): Radial 2+   Skin (include color):   pale   ____________________________________________   FINAL  CLINICAL IMPRESSION(S) / ED DIAGNOSES  Final diagnoses:  Altered mental status, unspecified altered mental status type  Hypernatremia  Hypercalcemia  Lactic acidosis  Acute renal insufficiency  Uremia  Sepsis, due to unspecified organism (HCC)  Aspiration pneumonia of left lung, unspecified aspiration pneumonia type, unspecified part of lung ([place])      NEW MEDICATIONS STARTED DURING THIS VISIT:  New Prescriptions   No medications on file     Note:  This document was prepared using Dragon voice recognition software and may include unintentional dictation errors.    Loney Hering, MD 04/19/2017 [phone]

## 2017-05-14 NOTE — ED Notes (Signed)
AED pads placed on patient

## 2017-05-14 NOTE — ED Notes (Signed)
Patient placed back on vent, taken off manual ventillation

## 2017-05-14 NOTE — H&P (Signed)
[facility] NAME: Ana Nielsen    MR#:  [id number]  DATE OF BIRTH:  18-Dec-1950  DATE OF ADMISSION:  05/02/2017  PRIMARY CARE PHYSICIAN: Dr Frazier Richards   REQUESTING/REFERRING PHYSICIAN: dr Dahlia Client  CHIEF COMPLAINT:   Respiratory distress HISTORY OF PRESENT ILLNESS:  Ana Nielsen  is a 66 y.o. female with a known history of Vascular dementia left-sided hemiparesis from previous stroke and depression who presents from [facility] due to acute hypoxic respiratory failure. According to EMS patient was found with oxygen level in the 60s but it is unclear as to when patient was last seen with normal oxygen level. She was brought to the ER on a CPAP with oxygen saturations in the 70s. Patient is minimally responsive. She was moving her right arm which is her baseline. She was then intubated in the emergency room for acute hypoxic respiratory failure. She was started on Zosyn for pneumonia. She was found to have severe hypernatremia as well as hypercalcemia. She has been given 4 L of fluids along with calcitonin. CT the head is concerning for metastatic disease. Chest x-ray is concerning for possible rib fracture and left-sided pneumonia.  Case discussed with intensivist.  PAST MEDICAL HISTORY:   Past Medical History:  Diagnosis Date  . Depression   . Hyperlipidemia   . Hypertension   . Macular degeneration   . Stroke [facility])    left-sided hemiparesis  . Vascular dementia     PAST SURGICAL HISTORY:  Unable to obtain as patient is sedated on ventilator  SOCIAL HISTORY:   Social History  Substance Use Topics  . Smoking status: Former Smoker    Packs/day: 0.50    Types: Cigarettes  . Smokeless tobacco: Not on file  . Alcohol use No    FAMILY HISTORY:  Unable to obtain as patient is sedated on ventilator  DRUG ALLERGIES:  No Known Allergies  REVIEW OF SYSTEMS:   Review of Systems  Unable to perform ROS: Intubated     MEDICATIONS AT HOME:   Prior to Admission medications   Medication Sig Start Date End Date Taking? Authorizing Provider  aspirin EC 81 MG tablet Take 81 mg by mouth daily.   Yes [provider]  atorvastatin (LIPITOR) 20 MG tablet Take 20 mg by mouth daily.   Yes [provider]  carbamazepine (CARBATROL) 300 MG 12 hr capsule Take 300 mg by mouth 2 (two) times daily.   Yes [provider]  CRANBERRY-VITAMIN C-INULIN PO Take 30 mLs by mouth daily.   Yes [provider]  docusate sodium (COLACE) 100 MG capsule Take 100 mg by mouth 2 (two) times daily.   Yes [provider]  escitalopram (LEXAPRO) 20 MG tablet Take 20 mg by mouth daily.   Yes [provider]  fexofenadine (ALLEGRA ALLERGY) 60 MG tablet Take 60 mg by mouth daily as needed for allergies or rhinitis.   Yes [provider]  hydrochlorothiazide (HYDRODIURIL) 12.5 MG tablet Take 12.5 mg by mouth daily.   Yes [provider]  HYDROcodone-acetaminophen (NORCO/VICODIN) 5-325 MG tablet Take 1 tablet by mouth every 8 (eight) hours as needed for moderate pain.   Yes [provider]  magnesium hydroxide (MILK OF MAGNESIA) 400 MG/5ML suspension Take 30 mLs by mouth every 12 (twelve) hours as needed for mild constipation.   Yes [provider]  metoprolol tartrate (LOPRESSOR) 25 MG tablet Take 25 mg by mouth 2 (two) times daily.  Yes [provider]  Multiple Vitamin (MULTIVITAMIN) capsule Take 1 capsule by mouth daily.   Yes [provider]  polyethylene glycol (MIRALAX / GLYCOLAX) packet Take 17 g by mouth every 12 (twelve) hours as needed for mild constipation, moderate constipation or severe constipation.   Yes [provider]  QUEtiapine (SEROQUEL) 100 MG tablet Take 150 mg by mouth 2 (two) times daily.   Yes [provider]  ranitidine (ZANTAC) 150 MG tablet Take 150 mg by mouth at bedtime.   Yes [provider]      VITAL SIGNS:  Blood pressure 92/66, pulse (!) 108, temperature 97.7 F (36.5 C), temperature source Core (Comment), resp. rate (!) 22, height 5\' 4"  (1.626 m), weight 69 kg (152 lb 1.9 oz), SpO2 96 %.  PHYSICAL EXAMINATION:   Physical Exam  Constitutional: She is well-developed, well-nourished, and in no distress. No distress.  Intubated and sedated  HENT:  Head: Normocephalic.  Eyes: No scleral icterus.  Neck: Normal range of motion. No JVD present. No tracheal deviation present.  Cardiovascular: Normal rate, regular rhythm and normal heart sounds.  Exam reveals no gallop and no friction rub.   No murmur heard. Pulmonary/Chest: Effort normal. No respiratory distress. She has no wheezes. She has rales. She exhibits no tenderness.  Abdominal: Soft. Bowel sounds are normal. She exhibits no distension and no mass. There is no tenderness. There is no rebound and no guarding.  Musculoskeletal: She exhibits edema and deformity (rib on left side elevated). She exhibits no tenderness (left lower base).  Neurological: She displays abnormal reflex. She exhibits abnormal muscle tone.  Movements right side unintentionally left hand is contracted She has left sided hemiplegia  Sedated on vent  No gag reflexes. ER physician  Skin: Skin is warm. No rash noted. She is not diaphoretic. No erythema.  Psychiatric:  Sedated      LABORATORY PANEL:   CBC  Recent Labs Lab 04/18/2017 0450  WBC 12.3*  HGB 12.2  HCT 39.0  PLT 205   ------------------------------------------------------------------------------------------------------------------  Chemistries   Recent Labs Lab 05/09/2017 0450  NA 160*  K 4.0  CL 117*  CO2 29  GLUCOSE 156*  BUN 90*  CREATININE 2.41*  CALCIUM 14.4*  AST 99*  ALT 30  ALKPHOS 289*  BILITOT 0.9   ------------------------------------------------------------------------------------------------------------------  Cardiac  Enzymes  Recent Labs Lab 05/11/2017 0450  TROPONINI 0.15*   ------------------------------------------------------------------------------------------------------------------  RADIOLOGY:  Ct Head Wo Contrast  Result Date: 05/13/2017 CLINICAL DATA:  Unresponsive.  Hypoxia. EXAM: CT HEAD WITHOUT CONTRAST TECHNIQUE: Contiguous axial images were obtained from the base of the skull through the vertex without intravenous contrast. COMPARISON:  09/21/2010 FINDINGS: Brain: No evidence of acute infarction, hemorrhage, hydrocephalus, extra-axial collection or mass effect. New abnormal soft tissue density in the sella measuring up to 14 mm. Advanced chronic microvascular ischemia with low-density in the deep cerebral white matter and remote lacunar infarcts in the deep gray nuclei and deep white matter tracts. Generalized cerebral atrophy. Vascular: Atherosclerotic calcification. Skull: Multiple new lytic lesions, most notably in the left posterior mastoid and squamosal temporal bone, extending into the parietal bone. There is also a large lesion at the greater wing right sphenoid bone, with adjacent dural thickening in the middle cranial fossa. Sinuses/Orbits: Nasopharyngeal fluid in the setting of intubation. 2 cm long mass in the lateral left orbit, with proptosis and mass effect on the optic nerve. IMPRESSION: 1. No acute intracranial finding. 2. Multiple aggressive bone lesions with left temporoparietal  and right temporal dural invasion, a metastatic pattern. 3. 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 4. 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 5. Advanced chronic microvascular ischemic injury that has progressed from 2011. Electronically Signed   By: Monte Fantasia M.D.   On: 04/23/2017 07:04   Dg Chest Portable 1 View  Result Date: 05/11/2017 CLINICAL DATA:  Acute onset of hypoxia.  Intubation. EXAM: PORTABLE CHEST 1 VIEW COMPARISON:  05/06/2017 FINDINGS: Endotracheal tube tip  measures 3.6 cm above the carina. Enteric tube tip is off field of view but below the left hemidiaphragm. Improved inspiratory effort since previous study. Mild atelectasis or infiltration again demonstrated in the left lung base. No blunting of costophrenic angles. No pneumothorax. Calcification of the aorta. IMPRESSION: Improved inspiration since previous study. Persistent infiltration or atelectasis in the left lung base. Appliances appear in satisfactory position. Electronically Signed   By: Lucienne Capers M.D.   On: 05/13/2017 06:35   Dg Chest Portable 1 View  Result Date: 05/10/2017 CLINICAL DATA:  Respiratory distress.  Tube placement. EXAM: PORTABLE CHEST 1 VIEW COMPARISON:  08/18/2015 FINDINGS: Endotracheal tube with tip measuring 3.8 cm above the carina. Enteric tube tip is off the field of view but below the left hemidiaphragm. Shallow inspiration with elevation left hemidiaphragm. Heart size and pulmonary vascularity are normal. Interstitial changes in the lungs likely representing fibrosis and bronchitic changes. Possible superimposed infiltration in the left lung base may represent pneumonia or atelectasis. Calcified and tortuous aorta. No pneumothorax. No pleural effusions. IMPRESSION: Appliances appear in satisfactory position. Shallow inspiration with elevation of left hemidiaphragm and atelectasis or infiltration in the left lung base. Chronic bronchitic changes and fibrosis. Electronically Signed   By: Lucienne Capers M.D.   On: 05/01/2017 05:35    EKG:   Sinus tachycardia right bundle branch block left axis deviation and ST depression in lateral leads  IMPRESSION AND PLAN:   66 year old female with advanced dementia, left-sided hemiparesis from previous CVA who presents with acute hypoxic respiratory failure and acute encephalopathy.  1. Acute hypoxic respiratory failure with oxygen saturations in the 60s requiring intubation in the setting of left lower lobe  pneumonia/HCAP Continue current vent settings Intensivist consult. I have discussed case with Dr. Jamal Collin  2. Sepsis due to HCAP with hypotension: Patient presents with tachypnea, tachycardia and hypotension Continue Zosyn Follow up on blood cultures Start pressors if needed to keep MAP>70   3. Severe hypernatremia: Patient appears to be volume depleted She has received 4 L normal saline Repeat BMP and consider starting D5 W Follow sodium levels  4. Hypercalcemia: Could be related to dehydration or metastatic disease (possibly long) Treated with calcitonin Repeat calcium  5. Acute kidney injury in the setting of sepsis and acute hypoxic respiratory failure Continue fluids and monitor BMP Nephrology consulted needed  6. History of CVA with left-sided hemiparesis  7. 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 Patient would most benefit from positive care consult. It is likely the patient has metastatic cancer as evident by CT scan of the brain.  All the records are reviewed and case discussed with ED provider and dr simonds.   CODE STATUS: FULL  Critical care TOTAL TIME TAKING CARE OF THIS PATIENT: 50 minutes.    Ana Nielsen M.D on 04/29/2017 at 9:42 AM  Between 7am to 6pm - Pager - [phone]  After 6pm go to [email or web address] - Proofreader  [employer]  [phone]  CC: Primary care physician; Patient, No Pcp Per
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
## 2017-05-14 NOTE — Progress Notes (Signed)
Peep increased to 10 fio2 increased to 100% per Dr Dahlia Client.  ET tube also advanced to 23cm as well per md verbal order.

## 2017-05-14 NOTE — ED Notes (Signed)
Respiratory adjusted tube placement per MD Dahlia Client.  Airway at 23 at the lip

## 2017-05-14 NOTE — ED Notes (Addendum)
MD, respiratory at bedside for patient arrival.   MD intubated patient at 55.  Size 7 1/2 tube. Tube 19 at the lip
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
## 2017-05-14 NOTE — ED Triage Notes (Signed)
Patient from [facility]. Pt hx unknown, other than reported no known allergies. Pt's oxygen saturation in low 60s prior to CPAP. Pt O2 sat increased to 72% on CPAP. Pt responsive to painful stimuli

## 2017-05-14 NOTE — ED Notes (Signed)
ED Provider at bedside. 

## 2017-05-14 NOTE — Consult Note (Signed)
PULMONARY / CRITICAL CARE MEDICINE   Name: Ana Nielsen MRN: [id number] DOB: December 19, 1950    ADMISSION DATE:  05/12/2017   PT PROFILE: 37 F NH resident with severe chronic debilitation and cognitive impairment due to prior right hemispheric CVA brought to [facility] ED on morning of admission for unresponsiveness and hypoxemia. Intubated in ED for same. She received 4 L NS for hypotension  HISTORY OF PRESENT ILLNESS:   Level V caveat  PAST MEDICAL HISTORY :  She  has a past medical history of Depression; Hyperlipidemia; Hypertension; Macular degeneration; Stroke [facility]); and Vascular dementia.  PAST SURGICAL HISTORY: She  has no past surgical history on file.  No Known Allergies  No current facility-administered medications on file prior to encounter.    No current outpatient prescriptions on file prior to encounter.    FAMILY HISTORY:  Her has no family status information on file.    SOCIAL HISTORY: She  reports that she has quit smoking. Her smoking use included Cigarettes. She smoked 0.50 packs per day. She does not have any smokeless tobacco history on file. She reports that she does not drink alcohol.  REVIEW OF SYSTEMS:   Level V caveat  SUBJECTIVE:    VITAL SIGNS: BP 100/64 (BP Location: Right Arm)   Pulse (!) 108   Temp (!) 100.9 F (38.3 C) (Core (Comment))   Resp (!) 24   Ht 5\' 4"  (1.626 m)   Wt 152 lb 1.9 oz (69 kg)   SpO2 97%   BMI 26.11 kg/m   HEMODYNAMICS:    VENTILATOR SETTINGS: Vent Mode: PRVC FiO2 (%):  [70 %-100 %] 100 % Set Rate:  [16 bmp] 16 bmp Vt Set:  [420 mL-500 mL] 500 mL PEEP:  [5 cmH20-10 cmH20] 10 cmH20 Plateau Pressure:  [20 cmH20] 20 cmH20  INTAKE / OUTPUT: No intake/output data recorded.  PHYSICAL EXAMINATION: General: Unresponsive/comatose, chronically ill-appearing, labored ventilatory effort Neuro: PERRLA, EOMI, left upper extremity contractures, bilateral foot drop, no spontaneous movement HEENT: NCAT, sclerae  white Cardiovascular: Mildly tachycardia, regular, no murmurs Chest/Lungs: Old contusion on R anterior chest with bony crepitations Bronchial breath sounds in LLL, no wheezes Abdomen: Soft, no palpable masses, diminished bowel sounds Extremities: Cool, no edema, multiple ecchymoses Skin:  Sacral decubitus pressure wound  LABS:  BMET  Recent Labs Lab 05/12/2017 0450  NA 160*  K 4.0  CL 117*  CO2 29  BUN 90*  CREATININE 2.41*  GLUCOSE 156*    Electrolytes  Recent Labs Lab 04/19/2017 0450  CALCIUM 14.4*    CBC  Recent Labs Lab 05/02/2017 0450  WBC 12.3*  HGB 12.2  HCT 39.0  PLT 205    Coag's No results for input(s): APTT, INR in the last 168 hours.  Sepsis Markers  Recent Labs Lab 05/08/2017 0538 04/30/2017 0901  LATICACIDVEN 3.2* 1.6    ABG  Recent Labs Lab 04/22/2017 0510 04/16/2017 0655  PHART 7.33* 7.33*  PCO2ART 48 40  PO2ART 70* 83    Liver Enzymes  Recent Labs Lab 04/26/2017 0450  AST 99*  ALT 30  ALKPHOS 289*  BILITOT 0.9  ALBUMIN 3.3*    Cardiac Enzymes  Recent Labs Lab 04/25/2017 0450  TROPONINI 0.15*    Glucose  Recent Labs Lab 04/28/2017 0856  GLUCAP 122*    Imaging Ct Head Wo Contrast  Result Date: 05/05/2017 CLINICAL DATA:  Unresponsive.  Hypoxia. EXAM: CT HEAD WITHOUT CONTRAST TECHNIQUE: Contiguous axial images were obtained from the base of the skull through the  vertex without intravenous contrast. COMPARISON:  09/21/2010 FINDINGS: Brain: No evidence of acute infarction, hemorrhage, hydrocephalus, extra-axial collection or mass effect. New abnormal soft tissue density in the sella measuring up to 14 mm. Advanced chronic microvascular ischemia with low-density in the deep cerebral white matter and remote lacunar infarcts in the deep gray nuclei and deep white matter tracts. Generalized cerebral atrophy. Vascular: Atherosclerotic calcification. Skull: Multiple new lytic lesions, most notably in the left posterior mastoid and  squamosal temporal bone, extending into the parietal bone. There is also a large lesion at the greater wing right sphenoid bone, with adjacent dural thickening in the middle cranial fossa. Sinuses/Orbits: Nasopharyngeal fluid in the setting of intubation. 2 cm long mass in the lateral left orbit, with proptosis and mass effect on the optic nerve. IMPRESSION: 1. No acute intracranial finding. 2. 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 3. 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 4. 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 5. Advanced chronic microvascular ischemic injury that has progressed from 2011. Electronically Signed   By: Monte Fantasia M.D.   On: 05/09/2017 07:04   Dg Chest Portable 1 View  Result Date: 05/06/2017 CLINICAL DATA:  Acute onset of hypoxia.  Intubation. EXAM: PORTABLE CHEST 1 VIEW COMPARISON:  04/15/2017 FINDINGS: Endotracheal tube tip measures 3.6 cm above the carina. Enteric tube tip is off field of view but below the left hemidiaphragm. Improved inspiratory effort since previous study. Mild atelectasis or infiltration again demonstrated in the left lung base. No blunting of costophrenic angles. No pneumothorax. Calcification of the aorta. IMPRESSION: Improved inspiration since previous study. Persistent infiltration or atelectasis in the left lung base. Appliances appear in satisfactory position. Electronically Signed   By: Lucienne Capers M.D.   On: 05/01/2017 06:35   Dg Chest Portable 1 View  Result Date: 04/26/2017 CLINICAL DATA:  Respiratory distress.  Tube placement. EXAM: PORTABLE CHEST 1 VIEW COMPARISON:  08/18/2015 FINDINGS: Endotracheal tube with tip measuring 3.8 cm above the carina. Enteric tube tip is off the field of view but below the left hemidiaphragm. Shallow inspiration with elevation left hemidiaphragm. Heart size and pulmonary vascularity are normal. Interstitial changes  in the lungs likely representing fibrosis and bronchitic changes. Possible superimposed infiltration in the left lung base may represent pneumonia or atelectasis. Calcified and tortuous aorta. No pneumothorax. No pleural effusions. IMPRESSION: Appliances appear in satisfactory position. Shallow inspiration with elevation of left hemidiaphragm and atelectasis or infiltration in the left lung base. Chronic bronchitic changes and fibrosis. Electronically Signed   By: Lucienne Capers M.D.   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 P:   Monitor BMET intermittently Monitor I/Os Correct electrolytes as indicated I canceled nephrology consultation - see discussion below  GASTROINTESTINAL A:   No acute issues identified P:   SUP: IV famotidine Consider TF protocol 07/31  HEM/ONC A:   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 She has little further to add to the scant history documented above. She did note that the patient's left eye was not moving normally last week. At baseline, the patient is extremely debilitated both physically and cognitively. She is largely bedbound and often  disoriented. I explained to the patient's daughter that she is extremely critically ill (superimposed on severe chronic debilitation) and likely has a previously undiagnosed malignancy with metastases. Therefore, we agreed to not escalate our current level of care. DNR in event of cardiac arrest, no dialysis, no vasopressors. We agreed that this current level of care would be for no more than a couple or few days depending on patient's response to our efforts. Our priority will be to ensure that the patient is not suffering.   CCM time: 60 mins   The above time includes time spent in consultation with patient and/or family members and reviewing care plan on multidisciplinary rounds  Merton Border, MD PCCM service Mobile [phone] Pager [phone] 04/20/2017, 5:23 PM

## 2017-05-14 NOTE — Care Management (Signed)
This RNCM received call from Juleen Starr at [facility] requesting callback from this [facility] [phone]. She said that she was referred to me by unit clerk in ICU which I confirmed. That was a misunderstanding I've learned and there truly wasn't a need for [facility] to contact this RNCM. Levada Dy wanted admitting diagnosis and requested to know Sodium level of patient. I stated that admitting diagnosis was respiratory failure requiring intubation which is why they sent patient to ED. She understood from someone she talked to today told her that patient's Sodium was low and I explained that it was high however I did not know the number. She states that patient has lived at their facility for many years. She then mentioned that EMS had told staff at [facility] that her Oceanographer) nurse didn't give a very good report. I explained that I was not aware of any other information.  I shared that I appreciated her call and that this information will be shared with them at time of discharge.

## 2017-05-14 NOTE — ED Notes (Signed)
Patient's oxygen saturation dropped to 67%. MD and respiratory informed. MD and respiratory to bedside. Respiratory began manual ventilation at 558

## 2017-05-14 NOTE — Progress Notes (Signed)
[facility] notified of declining BP. Informed Elink nurse that Dr. Alva Garnet told RN after a family meeting that patient was a DNR and to not escalate care including starting pressors. There is not a note in with this plan. Waiting elink RN to talk with [facility] physician and call back.

## 2017-05-14 NOTE — ED Notes (Signed)
Report to ICU. Await respiratory assistance for transfer to ICU

## 2017-05-14 NOTE — Progress Notes (Signed)
Consult cancelled by Dr Alva Garnet. Please re-consult as necessary

## 2017-05-15 ENCOUNTER — Inpatient Hospital Stay: Payer: Medicare Other

## 2017-05-15 DIAGNOSIS — R6520 Severe sepsis without septic shock: Secondary | ICD-10-CM

## 2017-05-15 DIAGNOSIS — A419 Sepsis, unspecified organism: Principal | ICD-10-CM

## 2017-05-15 LAB — BLOOD CULTURE ID PANEL (REFLEXED)
ACINETOBACTER BAUMANNII: NOT DETECTED
CANDIDA ALBICANS: NOT DETECTED
Candida glabrata: NOT DETECTED
Candida krusei: NOT DETECTED
Candida parapsilosis: NOT DETECTED
Candida tropicalis: NOT DETECTED
ENTEROBACTERIACEAE SPECIES: NOT DETECTED
Enterobacter cloacae complex: NOT DETECTED
Enterococcus species: NOT DETECTED
Escherichia coli: NOT DETECTED
HAEMOPHILUS INFLUENZAE: NOT DETECTED
Klebsiella oxytoca: NOT DETECTED
Klebsiella pneumoniae: NOT DETECTED
Listeria monocytogenes: NOT DETECTED
METHICILLIN RESISTANCE: DETECTED — AB
NEISSERIA MENINGITIDIS: NOT DETECTED
PSEUDOMONAS AERUGINOSA: NOT DETECTED
Proteus species: NOT DETECTED
STREPTOCOCCUS PNEUMONIAE: NOT DETECTED
STREPTOCOCCUS PYOGENES: NOT DETECTED
STREPTOCOCCUS SPECIES: NOT DETECTED
Serratia marcescens: NOT DETECTED
Staphylococcus aureus (BCID): NOT DETECTED
Staphylococcus species: DETECTED — AB
Streptococcus agalactiae: NOT DETECTED

## 2017-05-15 LAB — CBC
HCT: 32.6 % — ABNORMAL LOW (ref 35.0–47.0)
Hemoglobin: 10.3 g/dL — ABNORMAL LOW (ref 12.0–16.0)
MCH: 26.6 pg (ref 26.0–34.0)
MCHC: 31.7 g/dL — ABNORMAL LOW (ref 32.0–36.0)
MCV: 83.8 fL (ref 80.0–100.0)
Platelets: 119 10*3/uL — ABNORMAL LOW (ref 150–440)
RBC: 3.89 MIL/uL (ref 3.80–5.20)
RDW: 17.3 % — ABNORMAL HIGH (ref 11.5–14.5)
WBC: 6.5 10*3/uL (ref 3.6–11.0)

## 2017-05-15 LAB — GLUCOSE, CAPILLARY
GLUCOSE-CAPILLARY: 173 mg/dL — AB (ref 65–99)
GLUCOSE-CAPILLARY: 172 mg/dL — AB (ref 65–99)
Glucose-Capillary: 186 mg/dL — ABNORMAL HIGH (ref 65–99)

## 2017-05-15 LAB — VANCOMYCIN, RANDOM: Vancomycin Rm: 30

## 2017-05-15 LAB — PHOSPHORUS: Phosphorus: 5 mg/dL — ABNORMAL HIGH (ref 2.5–4.6)

## 2017-05-15 LAB — BASIC METABOLIC PANEL
Anion gap: 10 (ref 5–15)
BUN: 84 mg/dL — AB (ref 6–20)
CO2: 22 mmol/L (ref 22–32)
CREATININE: 2.8 mg/dL — AB (ref 0.44–1.00)
Calcium: 11.7 mg/dL — ABNORMAL HIGH (ref 8.9–10.3)
Chloride: 118 mmol/L — ABNORMAL HIGH (ref 101–111)
GFR calc Af Amer: 19 mL/min — ABNORMAL LOW (ref >60)
GFR, EST NON AFRICAN AMERICAN: 16 mL/min — AB (ref >60)
Glucose, Bld: 226 mg/dL — ABNORMAL HIGH (ref 65–99)
POTASSIUM: 3.8 mmol/L (ref 3.5–5.1)
SODIUM: 150 mmol/L — AB (ref 135–145)

## 2017-05-15 LAB — MAGNESIUM: Magnesium: 1.8 mg/dL (ref 1.7–2.4)

## 2017-05-15 MED ORDER — FAMOTIDINE 20 MG PO TABS
20.0000 mg | ORAL_TABLET | Freq: Every day | ORAL | Status: DC
  Administered 2017-05-15 – 2017-05-16 (×2): 20 mg via ORAL
  Filled 2017-05-15 (×2): qty 1

## 2017-05-15 MED ORDER — VITAL AF 1.2 CAL PO LIQD
1000.0000 mL | ORAL | Status: DC
  Administered 2017-05-15 – 2017-05-18 (×5): 1000 mL

## 2017-05-15 MED ORDER — STERILE WATER FOR INJECTION IJ SOLN
INTRAMUSCULAR | Status: AC
  Administered 2017-05-15: 10 mL
  Filled 2017-05-15: qty 10

## 2017-05-15 MED ORDER — VITAL AF 1.2 CAL PO LIQD: 1000.0000 mL | ORAL | Status: DC

## 2017-05-15 MED ORDER — HEPARIN SODIUM (PORCINE) 5000 UNIT/ML IJ SOLN
5000.0000 [IU] | Freq: Two times a day (BID) | INTRAMUSCULAR | Status: DC
  Administered 2017-05-15: 5000 [IU] via SUBCUTANEOUS
  Filled 2017-05-15: qty 1

## 2017-05-15 MED ORDER — VITAL HIGH PROTEIN PO LIQD
1000.0000 mL | ORAL | Status: DC
  Administered 2017-05-15: 1000 mL

## 2017-05-15 MED ORDER — FREE WATER
100.0000 mL | Freq: Four times a day (QID) | Status: DC
  Administered 2017-05-15 – 2017-05-16 (×4): 100 mL

## 2017-05-15 MED ORDER — CEFTAZIDIME 1 G IJ SOLR
1.0000 g | Freq: Every day | INTRAMUSCULAR | Status: DC
  Administered 2017-05-15 – 2017-05-16 (×2): 1 g via INTRAMUSCULAR
  Filled 2017-05-15 (×2): qty 1

## 2017-05-15 NOTE — Progress Notes (Addendum)
[place] [facility] [place] NAME: Ana Nielsen    MR#:  [id number]  DATE OF BIRTH:  1951/05/21  SUBJECTIVE:  CHIEF COMPLAINT:  Patient is intubated, CODE STATUS was changed to DO NOT RESUSCITATE by patient's daughter  REVIEW OF SYSTEMS:  Review of systems unobtainable as the patient is intubated  DRUG ALLERGIES:  No Known Allergies  VITALS:  Blood pressure (!) 84/52, pulse (!) 104, temperature 99.1 F (37.3 C), resp. rate (!) 24, height 5\' 4"  (1.626 m), weight 69 kg (152 lb 1.9 oz), SpO2 96 %.  PHYSICAL EXAMINATION:  GENERAL:  66 y.o.-year-old patient lying in the bed with no acute distress.  EYES: Pupils equal, round, reactive to light and accommodation. HEENT: Head atraumatic, normocephalic. Oropharynx With ET tube NECK:  Supple, no jugular venous distention. No thyroid enlargement, no tenderness.  LUNGS: Diminished breath sounds, intubated CARDIOVASCULAR: S1, S2 normal. No murmurs, rubs, or gallops.  ABDOMEN: Soft, nontender, nondistended.  EXTREMITIES: No pedal edema, cyanosis, or clubbing.  NEUROLOGIC: Currently intubated and has an chronic dementia  PSYCHIATRIC: The patient is alert and oriented x 3.  SKIN: No obvious rash, lesion, or ulcer.    LABORATORY PANEL:   CBC  Recent Labs Lab 05/15/17 0338  WBC 6.5  HGB 10.3*  HCT 32.6*  PLT 119*   ------------------------------------------------------------------------------------------------------------------  Chemistries   Recent Labs Lab 04/23/2017 0450 05/15/17 0338  NA 160* 150*  K 4.0 3.8  CL 117* 118*  CO2 29 22  GLUCOSE 156* 226*  BUN 90* 84*  CREATININE 2.41* 2.80*  CALCIUM 14.4* 11.7*  MG  --  1.8  AST 99*  --   ALT 30  --   ALKPHOS 289*  --   BILITOT 0.9  --    ------------------------------------------------------------------------------------------------------------------  Cardiac Enzymes  Recent Labs Lab 05/13/2017 0450  TROPONINI 0.15*    ------------------------------------------------------------------------------------------------------------------  RADIOLOGY:  Ct Head Wo Contrast  Result Date: 04/20/2017 CLINICAL DATA:  Unresponsive.  Hypoxia. EXAM: CT HEAD WITHOUT CONTRAST TECHNIQUE: Contiguous axial images were obtained from the base of the skull through the vertex without intravenous contrast. COMPARISON:  09/21/2010 FINDINGS: Brain: No evidence of acute infarction, hemorrhage, hydrocephalus, extra-axial collection or mass effect. New abnormal soft tissue density in the sella measuring up to 14 mm. Advanced chronic microvascular ischemia with low-density in the deep cerebral white matter and remote lacunar infarcts in the deep gray nuclei and deep white matter tracts. Generalized cerebral atrophy. Vascular: Atherosclerotic calcification. Skull: Multiple new lytic lesions, most notably in the left posterior mastoid and squamosal temporal bone, extending into the parietal bone. There is also a large lesion at the greater wing right sphenoid bone, with adjacent dural thickening in the middle cranial fossa. Sinuses/Orbits: Nasopharyngeal fluid in the setting of intubation. 2 cm long mass in the lateral left orbit, with proptosis and mass effect on the optic nerve. IMPRESSION: 1. No acute intracranial finding. 2. 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 3. 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 4. 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 5. Advanced chronic microvascular ischemic injury that has progressed from 2011. Electronically Signed   By: Monte Fantasia M.D.   On: 04/15/2017 07:04   Dg Chest Port 1 View  Result Date: 05/15/2017 CLINICAL DATA:  Respiratory failure. EXAM: PORTABLE CHEST 1 VIEW COMPARISON:  04/22/2017 FINDINGS: Endotracheal tube tip measures 3.5 cm above the carina. Enteric tube tip is off the  field of view but below  the left hemidiaphragm. Shallow inspiration. Heart size and pulmonary vascularity are normal. Since the previous study, there is developing nodular airspace infiltration in the left mid and lower lungs and in the right lung base. This suggest developing pneumonia. No blunting of costophrenic angles. No pneumothorax. Calcified aorta. Old bilateral rib fractures. IMPRESSION: Appliances appear in satisfactory position. Developing nodular infiltrates in the left mid lung and both lower lungs. Changes suggest developing pneumonia. Electronically Signed   By: Lucienne Capers M.D.   On: 05/15/2017 03:47   Dg Chest Portable 1 View  Result Date: 04/30/2017 CLINICAL DATA:  Acute onset of hypoxia.  Intubation. EXAM: PORTABLE CHEST 1 VIEW COMPARISON:  05/06/2017 FINDINGS: Endotracheal tube tip measures 3.6 cm above the carina. Enteric tube tip is off field of view but below the left hemidiaphragm. Improved inspiratory effort since previous study. Mild atelectasis or infiltration again demonstrated in the left lung base. No blunting of costophrenic angles. No pneumothorax. Calcification of the aorta. IMPRESSION: Improved inspiration since previous study. Persistent infiltration or atelectasis in the left lung base. Appliances appear in satisfactory position. Electronically Signed   By: Lucienne Capers M.D.   On: 04/30/2017 06:35   Dg Chest Portable 1 View  Result Date: 04/28/2017 CLINICAL DATA:  Respiratory distress.  Tube placement. EXAM: PORTABLE CHEST 1 VIEW COMPARISON:  08/18/2015 FINDINGS: Endotracheal tube with tip measuring 3.8 cm above the carina. Enteric tube tip is off the field of view but below the left hemidiaphragm. Shallow inspiration with elevation left hemidiaphragm. Heart size and pulmonary vascularity are normal. Interstitial changes in the lungs likely representing fibrosis and bronchitic changes. Possible superimposed infiltration in the left lung base may represent pneumonia or atelectasis.  Calcified and tortuous aorta. No pneumothorax. No pleural effusions. IMPRESSION: Appliances appear in satisfactory position. Shallow inspiration with elevation of left hemidiaphragm and atelectasis or infiltration in the left lung base. Chronic bronchitic changes and fibrosis. Electronically Signed   By: Lucienne Capers M.D.   On: 05/02/2017 05:35    EKG:   Orders placed or performed in visit on 01/03/12  . EKG 12-Lead    ASSESSMENT AND PLAN:    66 year old female with advanced dementia, left-sided hemiparesis from previous CVA who presents with acute hypoxic respiratory failure and acute encephalopathy.  1. Acute hypoxic respiratory failure with oxygen saturations in the 60s requiring intubation in the setting of left lower lobe pneumonia/HCAP Vent management by intensivist Patient's CODE STATUS was changed to DO NOT RESUSCITATE Intensivist consult. I have discussed case with Dr. Jamal Collin  2. Sepsis due to HCAP with hypotension: Patient presents with tachypnea, tachycardia and hypotension Continue Fortaz and vancomycin Follow up on blood cultures no growth so far Start pressors if needed to keep MAP>70   3. Severe hypernatremia: Secondary to volume depletion She has received 4 L normal saline in the emergency department currently on D5W Repeat BMP  Follow sodium levels 160-150  4. Hypercalcemia: Could be related to dehydration and paraneoplastic  Treated with calcitonin   Calcium 14.4-11.7    5. Acute kidney injury in the setting of sepsis and acute hypoxic respiratory failure Continue fluids and monitor BMP Nephrology consult as needed  6. History of CVA with left-sided hemiparesis  7. 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  patient's daughter might consider comfort care measures if no clinical improvement   CODE  STATUS: DNR  TOTAL TIME TAKING CARE OF THIS PATIENT: 26 minutes.   POSSIBLE D/C IN 2 DAYS, DEPENDING ON CLINICAL CONDITION.  Note: This dictation was prepared with Dragon dictation along with smaller phrase technology. Any transcriptional errors that result from this process are unintentional.   Nicholes Mango M.D on 05/15/2017 at 11:48 AM  Between 7am to 6pm - Pager - [phone] After 6pm go to [email or web address] - password [email or web address] [place]  Office  [phone]  CC: Primary care physician; Patient, No Pcp Per

## 2017-05-15 NOTE — Progress Notes (Signed)
Nutrition Follow Up Note   DOCUMENTATION CODES:   Not applicable  INTERVENTION:   Vital AF 1.2 via OGT @ goal rate of 26ml/hr  Free water flushes 121ml q 6 hrs  Regimen provides 1728kcal/day, 108g/day protein, 1515ml free water   NUTRITION DIAGNOSIS:   Inadequate oral intake related to inability to eat as evidenced by NPO status  GOAL:   Provide needs based on ASPEN/SCCM guidelines  MONITOR:   Vent status, Labs, Weight trends, TF tolerance, I & O's  ASSESSMENT:   66 yo female with PMH of depression, HTN, Stroke, Vascular dementia, transferred from [facility] on CPAP, now intubated   Pt continues to be ventilated. Pt with OGT on tube feeds; tolerating well. Pt with new brain mass and possible metastasis. Pt is anuric today. Per MD, pt possibly to transition to comfort care.   Medications reviewed and include: calcitonin, ceftazidime, pepcid, fentanyl heparin  Labs reviewed: Na 150(H), Cl 118(H), BUN 84(H), creat 2.80(H), Ca 11.7(H), P 5.0(H), Mg 1.8 wnl BNP 435(H) cbgs- 156, 226 x 24 hrs   Patient is currently intubated on ventilator support MV: 8.7 L/min Temp (24hrs), Avg:100.1 F (37.8 C), Min:98.1 F (36.7 C), Max:100.9 F (38.3 C)  MAP- 48-69  Propofol: none   Diet Order:   NPO  Skin:  Reviewed, no issues  Last BM:  PTA  Height:   Ht Readings from Last 1 Encounters:  05/08/2017 5\' 4"  (1.626 m)    Weight:   Wt Readings from Last 1 Encounters:  05/11/2017 152 lb 1.9 oz (69 kg)    Ideal Body Weight:  54.54 kg  BMI:  Body mass index is 26.11 kg/m.  Estimated Nutritional Needs:   Kcal:  1654kcal/day   Protein:  97-111 grams (1.4-1.6g/kg)   Fluid:  Per MD/NP/PA  EDUCATION NEEDS:   Education needs no appropriate at this time  Koleen Distance MS, RD, [place] Pager #[phone] After Hours Pager: [phone]

## 2017-05-15 NOTE — Progress Notes (Signed)
Pharmacy Antibiotic Note  Ana Nielsen is a 65 y.o. female with a h/o CVA admitted on 04/24/2017 with acute respiratory failure.  Pharmacy has been consulted for ceftazidime and vancomycin dosing. MRSA PCR is positive. Patient is in ARF with unknown baseline SCR.   Plan: Vancomycin 1000 mg iv once given. Will give an additional vancomycin 500 mg iv and check a random level in 24 hours. Goal trough of 15-20 mcg/ml.   7/31 @ 0338 VR 30. Will continue to hold doses of vanc until levels < 20 mcg/mL. Patient has a BCID showing MRSA, will f/u w/ next VR @ 1200 and assess levels then.  Ceftazidime 1 g iv q 24 hours.   Height: 5\' 4"  (162.6 cm) Weight: 152 lb 1.9 oz (69 kg) IBW/kg (Calculated) : 54.7  Temp (24hrs), Avg:99.5 F (37.5 C), Min:97.2 F (36.2 C), Max:100.9 F (38.3 C)   Recent Labs Lab 04/29/2017 0450 05/02/2017 0538 05/13/2017 0901 05/15/17 0338  WBC 12.3*  --   --  6.5  CREATININE 2.41*  --   --  2.80*  LATICACIDVEN  --  3.2* 1.6  --   VANCORANDOM  --   --   --  30    Estimated Creatinine Clearance: 18.6 mL/min (A) (by C-G formula based on SCr of 2.8 mg/dL (H)).    No Known Allergies  Antimicrobials this admission: Zosyn 7/30 x 1 vancomycin 7/30 >>  Ceftazidime 7/30 >>  Dose adjustments this admission:   Microbiology results: 7/30 BCx: NGTD 7/30 Sputum: sent  7/30 MRSA PCR: positive  Thank you for allowing pharmacy to be a part of this patient's care.  Tobie Lords, PharmD, BCPS Clinical Pharmacist 05/15/2017 5:32 AM

## 2017-05-15 NOTE — Progress Notes (Addendum)
PULMONARY / CRITICAL CARE MEDICINE   Name: Ana Nielsen MRN: [id number] DOB: 1951-08-03    ADMISSION DATE:  04/25/2017   PT PROFILE: 45 F NH resident with severe chronic debilitation and cognitive impairment due to prior right hemispheric CVA brought to [facility] ED on morning of admission for unresponsiveness and hypoxemia. Intubated in ED for same. She received 4 L NS for hypotension  MAJOR EVENTS/TEST RESULTS: 07/30 admitted as above 07/31 remains oligo-anuric. Remains minimally responsive.  INDWELLING DEVICES:: ETT 07/30 >>   MICRO DATA: MRSA PCR 07/30 >> positive Resp 07/30 >> abundant staph aureus, few GNR's >>  Blood 07/30 >> 1/2 coag neg staph >>   ANTIMICROBIALS:  Vancomycin 07/30 >>  Pip-tazo 07/30 >> 07/31 Ceftaz 07/31 >>    SUBJECTIVE:  Unresponsive on fentanyl infusion. Increased respiratory effort when fentanyl discontinued.  VITAL SIGNS: BP (!) 84/52   Pulse (!) 104   Temp 99.1 F (37.3 C)   Resp (!) 24   Ht 5\' 4"  (1.626 m)   Wt 152 lb 1.9 oz (69 kg)   SpO2 96%   BMI 26.11 kg/m   HEMODYNAMICS:    VENTILATOR SETTINGS: Vent Mode: PRVC FiO2 (%):  [50 %-100 %] 50 % Set Rate:  [16 bmp] 16 bmp Vt Set:  [400 mL-500 mL] 400 mL PEEP:  [5 cmH20-10 cmH20] 5 cmH20 Plateau Pressure:  [13 cmH20-23 cmH20] 23 cmH20  INTAKE / OUTPUT: I/O last 3 completed shifts: In: 6293.9 [I.V.:6293.9] Out: 33 [Urine:70]  PHYSICAL EXAMINATION: General: RASS -4, not F/C Neuro: PERRLA, LUE contractures, bilateral foot drop, no spontaneous movement HEENT: NCAT, sclerae white, mild left proptosis Cardiovascular: Regular, no murmurs Chest/Lungs: Old contusion on R anterior chest, bronchial breath sounds in LLL, no wheezes, few rhonchi Abdomen: Soft, no palpable masses, diminished bowel sounds Extremities: Cool, no edema, multiple ecchymoses Skin:  Sacral decubitus pressure wound  LABS:  BMET  Recent Labs Lab 04/19/2017 0450 05/15/17 0338  NA 160* 150*  K 4.0 3.8  CL  117* 118*  CO2 29 22  BUN 90* 84*  CREATININE 2.41* 2.80*  GLUCOSE 156* 226*    Electrolytes  Recent Labs Lab 04/21/2017 0450 05/15/17 0338  CALCIUM 14.4* 11.7*  MG  --  1.8  PHOS  --  5.0*    CBC  Recent Labs Lab 05/05/2017 0450 05/15/17 0338  WBC 12.3* 6.5  HGB 12.2 10.3*  HCT 39.0 32.6*  PLT 205 119*    Coag's No results for input(s): APTT, INR in the last 168 hours.  Sepsis Markers  Recent Labs Lab 05/08/2017 0538 05/03/2017 0901  LATICACIDVEN 3.2* 1.6    ABG  Recent Labs Lab 04/23/2017 0510 04/21/2017 0655  PHART 7.33* 7.33*  PCO2ART 48 40  PO2ART 70* 83    Liver Enzymes  Recent Labs Lab 05/13/2017 0450  AST 99*  ALT 30  ALKPHOS 289*  BILITOT 0.9  ALBUMIN 3.3*    Cardiac Enzymes  Recent Labs Lab 05/10/2017 0450  TROPONINI 0.15*    Glucose  Recent Labs Lab 04/15/2017 0856  GLUCAP 122*    CXR: Increased left lower lobe airspace disease   ASSESSMENT / PLAN:  PULMONARY A: Acute hypoxemic respiratory failure LLL HCAP P:   Cont full vent support - settings reviewed and/or adjusted Cont vent bundle Daily SBT if/when meets criteria  CARDIOVASCULAR A:  Septic shock DNR P:  After discussion with daughter, we have elected to not use vasopressors under any circumstances  RENAL A:   AKI, Oligo-anuric Volume  depletion, treated Severe hypernatremia due to profound dehydration, improving 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, improving P:   Monitor BMET intermittently Monitor I/Os Correct electrolytes as indicated Continue calcitonin  GASTROINTESTINAL A:   No acute issues identified P:   SUP: IV famotidine Initiate trickle TF's  HEM/ONC A:   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 P:  DVT px: SQ heparin Monitor CBC intermittently Transfuse per usual guidelines   INFECTIOUS A:   Severe sepsis HCAP, LLL P:   Monitor temp, WBC count Micro and abx as above  ENDOCRINE A:   Severe  hypercalcemia Mild hyperglycemia P:   Continue calcitonin Monitor glucose on chem panels  NEUROLOGIC A:   Coma ICU/vent associated discomfort P:   RASS goal: -2, -3 Continue fentanyl infusion   FAMILY: No family at bedside   CCM time: 35 mins   The above time includes time spent in consultation with patient and/or family members and reviewing care plan on multidisciplinary rounds  Merton Border, MD PCCM service Mobile [phone] Pager [phone] 05/15/2017, 11:07 AM

## 2017-05-15 NOTE — Progress Notes (Signed)
[place] Progress Note Patient Name: AIXA CORSELLO DOB: [birth year] MRN: [id number]   Date of Service  05/15/2017  HPI/Events of Note  Staph ? MRSA bacteremia  eICU Interventions  Dc ceftaz Continue  vanc     Intervention Category Major Interventions: Sepsis - evaluation and management Intermediate Interventions: Infection - evaluation and management  Crysta Gulick 05/15/2017, 4:54 AM

## 2017-05-15 NOTE — Progress Notes (Signed)
PHARMACY - PHYSICIAN COMMUNICATION CRITICAL VALUE ALERT - BLOOD CULTURE IDENTIFICATION (BCID)  Results for orders placed or performed during the hospital encounter of 04/18/2017  Blood Culture ID Panel (Reflexed) (Collected: 05/03/2017  5:38 AM)  Result Value Ref Range   Enterococcus species NOT DETECTED NOT DETECTED   Listeria monocytogenes NOT DETECTED NOT DETECTED   Staphylococcus species DETECTED (A) NOT DETECTED   Staphylococcus aureus NOT DETECTED NOT DETECTED   Methicillin resistance DETECTED (A) NOT DETECTED   Streptococcus species NOT DETECTED NOT DETECTED   Streptococcus agalactiae NOT DETECTED NOT DETECTED   Streptococcus pneumoniae NOT DETECTED NOT DETECTED   Streptococcus pyogenes NOT DETECTED NOT DETECTED   Acinetobacter baumannii NOT DETECTED NOT DETECTED   Enterobacteriaceae species NOT DETECTED NOT DETECTED   Enterobacter cloacae complex NOT DETECTED NOT DETECTED   Escherichia coli NOT DETECTED NOT DETECTED   Klebsiella oxytoca NOT DETECTED NOT DETECTED   Klebsiella pneumoniae NOT DETECTED NOT DETECTED   Proteus species NOT DETECTED NOT DETECTED   Serratia marcescens NOT DETECTED NOT DETECTED   Haemophilus influenzae NOT DETECTED NOT DETECTED   Neisseria meningitidis NOT DETECTED NOT DETECTED   Pseudomonas aeruginosa NOT DETECTED NOT DETECTED   Candida albicans NOT DETECTED NOT DETECTED   Candida glabrata NOT DETECTED NOT DETECTED   Candida krusei NOT DETECTED NOT DETECTED   Candida parapsilosis NOT DETECTED NOT DETECTED   Candida tropicalis NOT DETECTED NOT DETECTED    Name of physician (or Provider) Contacted: Rosilyn Mings  Changes to prescribed antibiotics required: Patient on vanc and ceftazidime w/ ARF -- getting stat random to assess therapy.  Tobie Lords, PharmD, BCPS Clinical Pharmacist 05/15/2017

## 2017-05-15 NOTE — Plan of Care (Signed)
Problem: Physical Regulation: Goal: Ability to maintain clinical measurements within normal limits will improve Outcome: Not Progressing Patient remains responsive only to pain, not able to follow commands. NST on monitor, BP remains low, per orders no escalation of care. Pain has been assessed and addressed as needed. Foley catheter remains in place, no urine output this shift. BM this shift. Tube feeds started this shift. Daughter and son at bedside, updated as needed.

## 2017-05-15 NOTE — Progress Notes (Signed)
Pharmacy Antibiotic Note  Ana Nielsen is a 66 y.o. female with a h/o CVA admitted on 05/03/2017 with acute respiratory failure.  Pharmacy has been consulted for ceftazidime and vancomycin dosing. MRSA PCR is positive. Patient is in ARF with unknown baseline SCR.   Patient had 70 ml UOP in last 24 hours.   Plan: Will recheck a random vancomycin level in 24 hours.   Ceftazidime 1 g iv q 24 hours.   Height: 5\' 4"  (162.6 cm) Weight: 152 lb 1.9 oz (69 kg) IBW/kg (Calculated) : 54.7  Temp (24hrs), Avg:100 F (37.8 C), Min:99.1 F (37.3 C), Max:100.9 F (38.3 C)   Recent Labs Lab 04/22/2017 0450 05/10/2017 0538 05/06/2017 0901 05/15/17 0338  WBC 12.3*  --   --  6.5  CREATININE 2.41*  --   --  2.80*  LATICACIDVEN  --  3.2* 1.6  --   VANCORANDOM  --   --   --  30    Estimated Creatinine Clearance: 18.6 mL/min (A) (by C-G formula based on SCr of 2.8 mg/dL (H)).    No Known Allergies  Antimicrobials this admission: Zosyn 7/30 x 1 vancomycin 7/30 >>  Ceftazidime 7/30 >>  Dose adjustments this admission:   Microbiology results: 7/30 BCx: NGTD 7/30 Sputum: S aureus and GNR 7/30 MRSA PCR: positive  Thank you for allowing pharmacy to be a part of this patient's care.  Ulice Dash D 05/15/2017 1:32 PM

## 2017-05-16 ENCOUNTER — Inpatient Hospital Stay: Payer: Medicare Other

## 2017-05-16 DIAGNOSIS — C7951 Secondary malignant neoplasm of bone: Secondary | ICD-10-CM

## 2017-05-16 DIAGNOSIS — D332 Benign neoplasm of brain, unspecified: Secondary | ICD-10-CM

## 2017-05-16 LAB — CBC
HCT: 29.1 % — ABNORMAL LOW (ref 35.0–47.0)
Hemoglobin: 9.5 g/dL — ABNORMAL LOW (ref 12.0–16.0)
MCH: 27.2 pg (ref 26.0–34.0)
MCHC: 32.8 g/dL (ref 32.0–36.0)
MCV: 83 fL (ref 80.0–100.0)
PLATELETS: 137 10*3/uL — AB (ref 150–440)
RBC: 3.51 MIL/uL — AB (ref 3.80–5.20)
RDW: 17.8 % — ABNORMAL HIGH (ref 11.5–14.5)
WBC: 10.3 10*3/uL (ref 3.6–11.0)

## 2017-05-16 LAB — VANCOMYCIN, RANDOM: Vancomycin Rm: 19

## 2017-05-16 LAB — COMPREHENSIVE METABOLIC PANEL
ALBUMIN: 2.3 g/dL — AB (ref 3.5–5.0)
ALT: 74 U/L — AB (ref 14–54)
AST: 112 U/L — AB (ref 15–41)
Alkaline Phosphatase: 206 U/L — ABNORMAL HIGH (ref 38–126)
Anion gap: 10 (ref 5–15)
BUN: 100 mg/dL — ABNORMAL HIGH (ref 6–20)
CHLORIDE: 110 mmol/L (ref 101–111)
CO2: 21 mmol/L — AB (ref 22–32)
CREATININE: 3.26 mg/dL — AB (ref 0.44–1.00)
Calcium: 11.6 mg/dL — ABNORMAL HIGH (ref 8.9–10.3)
GFR calc non Af Amer: 14 mL/min — ABNORMAL LOW (ref >60)
GFR, EST AFRICAN AMERICAN: 16 mL/min — AB (ref >60)
Glucose, Bld: 212 mg/dL — ABNORMAL HIGH (ref 65–99)
Potassium: 3.9 mmol/L (ref 3.5–5.1)
SODIUM: 141 mmol/L (ref 135–145)
Total Bilirubin: 0.9 mg/dL (ref 0.3–1.2)
Total Protein: 5.7 g/dL — ABNORMAL LOW (ref 6.5–8.1)

## 2017-05-16 LAB — GLUCOSE, CAPILLARY
GLUCOSE-CAPILLARY: 171 mg/dL — AB (ref 65–99)
Glucose-Capillary: 190 mg/dL — ABNORMAL HIGH (ref 65–99)
Glucose-Capillary: 214 mg/dL — ABNORMAL HIGH (ref 65–99)

## 2017-05-16 MED ORDER — FAMOTIDINE 20 MG PO TABS
20.0000 mg | ORAL_TABLET | Freq: Every day | ORAL | Status: DC
  Administered 2017-05-17 – 2017-05-19 (×3): 20 mg
  Filled 2017-05-16 (×3): qty 1

## 2017-05-16 MED ORDER — VANCOMYCIN HCL IN DEXTROSE 750-5 MG/150ML-% IV SOLN
750.0000 mg | Freq: Once | INTRAVENOUS | Status: AC
  Administered 2017-05-16: 750 mg via INTRAVENOUS
  Filled 2017-05-16: qty 150

## 2017-05-16 MED ORDER — FREE WATER
200.0000 mL | Freq: Three times a day (TID) | Status: DC
  Administered 2017-05-16 – 2017-05-17 (×4): 200 mL

## 2017-05-16 MED ORDER — STERILE WATER FOR INJECTION IJ SOLN
INTRAMUSCULAR | Status: AC
  Administered 2017-05-16: 10 mL
  Filled 2017-05-16: qty 10

## 2017-05-16 NOTE — Clinical Social Work Note (Signed)
CSW assessment deferred at this time due to patient's worsening condition. Physician has recommended patient's daughter consider one way extubation and she wishes to speak with her brother prior to making that decision. Shela Leff MSW,LCSW [phone]

## 2017-05-16 NOTE — Progress Notes (Signed)
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 Intubated in ED for same. She received 4 L NS for hypotension  MAJOR EVENTS/TEST RESULTS: 07/30 admitted as above 07/31 remains oligo-anuric. Remains minimally responsive.  INDWELLING DEVICES:: ETT 07/30 >>   MICRO DATA: MRSA PCR 07/30 >> positive Resp 07/30 >> abundant staph aureus, few klebsiella  Blood 07/30 >> 1/2 coag neg staph >>   ANTIMICROBIALS:  Vancomycin 07/30 >>  Pip-tazo 07/30 >> 07/31 Ceftaz 07/31 >>    SUBJECTIVE:  Mildly dyssynchronous with ventilator, grimacing. Fentanyl infusion increased with improved comfort.   VITAL SIGNS: BP (!) 97/56 (BP Location: Right Arm)   Pulse (!) 102   Temp 97.9 F (36.6 C) (Core (Comment))   Resp 10   Ht 5\' 4"  (1.626 m)   Wt 132 lb 4.4 oz (60 kg)   SpO2 96%   BMI 22.71 kg/m   HEMODYNAMICS:    VENTILATOR SETTINGS: Vent Mode: PRVC FiO2 (%):  [40 %-50 %] 40 % Set Rate:  [16 bmp] 16 bmp Vt Set:  [400 mL] 400 mL PEEP:  [5 cmH20] 5 cmH20 Pressure Support:  [5 cmH20] 5 cmH20 Plateau Pressure:  [6 cmH20-8 cmH20] 7 cmH20  INTAKE / OUTPUT: I/O last 3 completed shifts: In: 3992.6 [I.V.:2825.3; NG/GT:1167.3] Out: 120 [Urine:75; Emesis/NG output:45]  PHYSICAL EXAMINATION: General: RASS -4, not F/C Neuro: PERRLA, LUE contractures, bilateral foot drop, minimal spontaneous movement HEENT: NCAT, sclerae white, mild left proptosis Cardiovascular: Regular, no murmurs Chest/Lungs: no wheezes, few rhonchi Abdomen: Soft, no palpable masses, diminished bowel sounds Extremities: no edema, multiple ecchymoses Skin:  Sacral decubitus pressure wound  LABS:  BMET  Recent Labs Lab 05/15/2017 0450  05/15/17 0338 05/16/17 0617  NA 160* 150* 141  K 4.0 3.8 3.9  CL 117* 118* 110  CO2 29 22 21*  BUN 90* 84* 100*  CREATININE 2.41* 2.80* 3.26*  GLUCOSE 156* 226* 212*    Electrolytes  Recent Labs Lab 05/07/2017 0450 05/15/17 0338 05/16/17 0617  CALCIUM 14.4* 11.7* 11.6*  MG  --  1.8  --   PHOS  --  5.0*  --     CBC  Recent Labs Lab 04/26/2017 0450 05/15/17 0338 05/16/17 0617  WBC 12.3* 6.5 10.3  HGB 12.2 10.3* 9.5*  HCT 39.0 32.6* 29.1*  PLT 205 119* 137*    Coag's No results for input(s): APTT, INR in the last 168 hours.  Sepsis Markers  Recent Labs Lab 05/15/2017 0538 05/13/2017 0901  LATICACIDVEN 3.2* 1.6    ABG  Recent Labs Lab 05/15/2017 0510 04/20/2017 0655  PHART 7.33* 7.33*  PCO2ART 48 40  PO2ART 70* 83    Liver Enzymes  Recent Labs Lab 04/17/2017 0450 05/16/17 0617  AST 99* 112*  ALT 30 74*  ALKPHOS 289* 206*  BILITOT 0.9 0.9  ALBUMIN 3.3* 2.3*    Cardiac Enzymes  Recent Labs Lab 04/16/2017 0450  TROPONINI 0.15*    Glucose  Recent Labs Lab 05/15/17 1214 05/15/17 1741 05/15/17 2000 05/16/17 0018 05/16/17 0406 05/16/17 0718  GLUCAP 186* 173* 172* 171* 190* 214*    CXR: [place]   ASSESSMENT / PLAN:  PULMONARY A: Acute hypoxemic respiratory failure LLL HCAP P:   Cont vent support - settings  reviewed and/or adjusted Cont vent bundle Daily SBT if/when meets criteria  CARDIOVASCULAR A:  Septic shock DNR P:  Continue monitoring  RENAL A:   AKI, Oligo-anuric Volume depletion, treated Severe hypernatremia due to profound dehydration, resolved 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, improving P:   Monitor BMET intermittently Monitor I/Os Correct electrolytes as indicated Continue calcitonin  GASTROINTESTINAL A:   No acute issues identified P:   SUP: IV famotidine Continue trickle TF's  HEM/ONC A:   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 I again described a very poor prognosis, especially in light of apparent metastatic cancer. I suggested one way extubation and focus on comfort. She wishes to discuss with her brother first.   CCM time: 35 mins   The above time includes time spent in consultation with patient and/or family members and reviewing care plan on multidisciplinary rounds  Merton Border, MD PCCM service Mobile [phone] Pager [phone] 05/16/2017, 1:04 PM

## 2017-05-16 NOTE — Care Management (Signed)
ICU staff attempting to reach patient's daughter Ana Nielsen unsuccessful. I attempted to reach out to [place] at provided number however there was no answer and unable to leave a message. CSW updated and she has been unsuccessful too. RNCM contacted [employer] [phone] for any other contact information- number provided for daughter Ana Nielsen [phone]. I spoke with Ana Nielsen and she states that she has talked with ICU "just now".

## 2017-05-16 NOTE — Progress Notes (Signed)
Chaplain was making rounds and visited with pt in [place]. Chaplain provided the ministry of prayer and a compassionate presence.    05/16/17 1115  Clinical Encounter Type  Visited With Patient  Visit Type Initial;Spiritual support  Referral From Nurse  Consult/Referral To Chaplain  Spiritual Encounters  Spiritual Needs Prayer

## 2017-05-16 NOTE — Progress Notes (Signed)
[place] at [place] NAME: Ana Nielsen    MR#:  [id number]  DATE OF BIRTH:  Sep 08, 1951  SUBJECTIVE:  CHIEF COMPLAINT:  Patient is intubated, CODE STATUS was changed to DO NOT RESUSCITATE by patient's daughter  REVIEW OF SYSTEMS:  Review of systems unobtainable as the patient is intubated  DRUG ALLERGIES:  No Known Allergies  VITALS:  Blood pressure (!) 100/59, pulse (!) 104, temperature 97.7 F (36.5 C), resp. rate 16, height 5\' 4"  (1.626 m), weight 60 kg (132 lb 4.4 oz), SpO2 97 %.  PHYSICAL EXAMINATION:  GENERAL:  66 y.o.-year-old patient lying in the bed with no acute distress.  EYES: Pupils equal, round, reactive to light and accommodation. HEENT: Head atraumatic, normocephalic. Oropharynx With ET tube NECK:  Supple, no jugular venous distention. No thyroid enlargement, no tenderness.  LUNGS: Diminished breath sounds, intubated CARDIOVASCULAR: S1, S2 normal. No murmurs, rubs, or gallops.  ABDOMEN: Soft, nontender, nondistended.  EXTREMITIES: No pedal edema, cyanosis NEUROLOGIC: Currently intubated and has an chronic dementia  PSYCHIATRIC: The patient is alert and oriented x 3.  SKIN: No obvious rash, lesion, or ulcer.    LABORATORY PANEL:   CBC  Recent Labs Lab 05/16/17 0617  WBC 10.3  HGB 9.5*  HCT 29.1*  PLT 137*   ------------------------------------------------------------------------------------------------------------------  Chemistries   Recent Labs Lab 05/15/17 0338 05/16/17 0617  NA 150* 141  K 3.8 3.9  CL 118* 110  CO2 22 21*  GLUCOSE 226* 212*  BUN 84* 100*  CREATININE 2.80* 3.26*  CALCIUM 11.7* 11.6*  MG 1.8  --   AST  --  112*  ALT  --  74*  ALKPHOS  --  206*  BILITOT  --  0.9   ------------------------------------------------------------------------------------------------------------------  Cardiac Enzymes  Recent Labs Lab 05/05/2017 0450  TROPONINI 0.15*    ------------------------------------------------------------------------------------------------------------------  RADIOLOGY:  Dg Chest Port 1 View  Result Date: 05/16/2017 CLINICAL DATA:  Respiratory failure. EXAM: PORTABLE CHEST 1 VIEW COMPARISON:  05/15/2017. FINDINGS: Endotracheal tube and NG tube in stable position. Heart size normal. Previously identified nodular infiltrates are again noted. Slight interim improvement. No pleural effusion or pneumothorax. IMPRESSION: 1. Lines and tubes in stable position. 2. Previously identified nodular infiltrates are again noted. Slight interim improvement . Electronically Signed   By: Marcello Moores  Register   On: 05/16/2017 06:31   Dg Chest Port 1 View  Result Date: 05/15/2017 CLINICAL DATA:  Respiratory failure. EXAM: PORTABLE CHEST 1 VIEW COMPARISON:  05/05/2017 FINDINGS: Endotracheal tube tip measures 3.5 cm above the carina. Enteric tube tip is off the field of view but below the left hemidiaphragm. Shallow inspiration. Heart size and pulmonary vascularity are normal. Since the previous study, there is developing nodular airspace infiltration in the left mid and lower lungs and in the right lung base. This suggest developing pneumonia. No blunting of costophrenic angles. No pneumothorax. Calcified aorta. Old bilateral rib fractures. IMPRESSION: Appliances appear in satisfactory position. Developing nodular infiltrates in the left mid lung and both lower lungs. Changes suggest developing pneumonia. Electronically Signed   By: Lucienne Capers M.D.   On: 05/15/2017 03:47    EKG:   Orders placed or performed in visit on 01/03/12  . EKG 12-Lead    ASSESSMENT AND PLAN:    66 year old female with advanced dementia, left-sided hemiparesis from previous CVA who presents with acute hypoxic respiratory failure and acute encephalopathy.  1. Acute hypoxic respiratory failure with oxygen saturations in the 60s requiring intubation  in the setting of left lower  lobe pneumonia/HCAP Vent management by intensivist Patient's CODE STATUS was changed to DO NOT RESUSCITATE,Daughter will consider changing the patient to comfort care if no clinical improvement  I have discussed case with Dr. Jamal Collin  2. Sepsis due to HCAP with hypotension: Patient presents with tachypnea, tachycardia and hypotension Continue Fortaz and vancomycin Follow up on blood cultures no growth so far Start pressors if needed to keep MAP>70   3. Severe hypernatremia: Secondary to volume depletion, improved She has received 4 L normal saline in the emergency department currently on D5W Follow sodium levels 160-150-141  4. Hypercalcemia: Could be related to dehydration and paraneoplastic  Treated with calcitonin   Calcium 14.4-11.7 -11.6   5. Acute kidney injury in the setting of sepsis and acute hypoxic respiratory failure Continue fluids and monitor BMP Nephrology consult as needed  6. History of CVA with left-sided hemiparesis  7. 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    CODE STATUS: DNR  TOTAL TIME TAKING CARE OF THIS PATIENT: 26 minutes.   POSSIBLE D/C IN 2 DAYS, DEPENDING ON CLINICAL CONDITION.  Note: This dictation was prepared with Dragon dictation along with smaller phrase technology. Any transcriptional errors that result from this process are unintentional.   Nicholes Mango M.D on 05/16/2017 at 11:53 AM  Between 7am to 6pm - Pager - [phone] After 6pm go to [email or web address] - password [email or web address] [place] [facility]  Office  [phone]  CC: Primary care physician; Patient, No Pcp Per

## 2017-05-16 NOTE — Progress Notes (Addendum)
Pharmacy Antibiotic Note  Ana Nielsen is a 66 y.o. female with a h/o CVA admitted on 05/01/2017 with acute respiratory failure.  Pharmacy has been consulted for ceftazidime and vancomycin dosing. MRSA PCR is positive. Patient is in ARF with unknown baseline SCR.   Patient had 70 ml UOP in last 24 hours.   Plan: Will order vancomycin 750 mg iv once and f/u goals of care. CCM is recommending comfort care.   Ceftazidime 1 g iv q 24 hours.   Height: 5\' 4"  (162.6 cm) Weight: 132 lb 4.4 oz (60 kg) IBW/kg (Calculated) : 54.7  Temp (24hrs), Avg:98 F (36.7 C), Min:97.5 F (36.4 C), Max:98.6 F (37 C)   Recent Labs Lab 05/07/2017 0450 05/06/2017 0538 04/19/2017 0901 05/15/17 0338 05/16/17 0617  WBC 12.3*  --   --  6.5 10.3  CREATININE 2.41*  --   --  2.80* 3.26*  LATICACIDVEN  --  3.2* 1.6  --   --   VANCORANDOM  --   --   --  30 19    Estimated Creatinine Clearance: 14.5 mL/min (A) (by C-G formula based on SCr of 3.26 mg/dL (H)).    No Known Allergies  Antimicrobials this admission: Zosyn 7/30 x 1 vancomycin 7/30 >>  Ceftazidime 7/30 >>  Dose adjustments this admission:   Microbiology results: 7/30 BCx: GPC (methicillin-resistant staph species per BCID) in 2/2 7/30 Sputum: S aureus and Klebsiella pneumoniae  7/30 MRSA PCR: positive  Thank you for allowing pharmacy to be a part of this patient's care.  Napoleon Form 05/16/2017 3:48 PM

## 2017-05-16 DEATH — deceased

## 2017-05-17 DIAGNOSIS — J15212 Pneumonia due to Methicillin resistant Staphylococcus aureus: Secondary | ICD-10-CM

## 2017-05-17 LAB — CULTURE, RESPIRATORY

## 2017-05-17 LAB — CULTURE, RESPIRATORY W GRAM STAIN

## 2017-05-17 LAB — CULTURE, BLOOD (ROUTINE X 2)

## 2017-05-17 MED ORDER — FREE WATER
100.0000 mL | Freq: Three times a day (TID) | Status: DC
  Administered 2017-05-17 – 2017-05-18 (×4): 100 mL

## 2017-05-17 MED ORDER — DEXTROSE 5 % IV SOLN
1.0000 g | INTRAVENOUS | Status: AC
  Administered 2017-05-17 – 2017-05-21 (×5): 1 g via INTRAVENOUS
  Filled 2017-05-17 (×5): qty 10

## 2017-05-17 NOTE — Progress Notes (Signed)
[place] at [place] NAME: Ana Nielsen    MR#:  [id number]  DATE OF BIRTH:  May 26, 1951  SUBJECTIVE:  CHIEF COMPLAINT: Remains on the ventilator  REVIEW OF SYSTEMS:  Review of systems unobtainable as the patient is intubated  DRUG ALLERGIES:  No Known Allergies  VITALS:  Blood pressure (!) 96/57, pulse (!) 105, temperature 98.4 F (36.9 C), resp. rate 10, height 5\' 4"  (1.626 m), weight 137 lb 9.1 oz (62.4 kg), SpO2 94 %.  PHYSICAL EXAMINATION:  GENERAL:  66 y.o.-year-old patient lying in the bed Critically ill appearing  EYES: Pupils equal, round, reactive to light and accommodation. HEENT: Head atraumatic, normocephalic. Oropharynx With ET tube NECK:  Supple, no jugular venous distention. No thyroid enlargement, no tenderness.  LUNGS: Diminished breath sounds, intubated CARDIOVASCULAR: S1, S2 normal. No murmurs, rubs, or gallops.  ABDOMEN: Soft, nontender, nondistended.  EXTREMITIES: No pedal edema, cyanosis NEUROLOGIC: Currently intubated and has an chronic dementia  PSYCHIATRIC: Intubated  SKIN: No obvious rash, lesion, or ulcer.    LABORATORY PANEL:   CBC  Recent Labs Lab 05/16/17 0617  WBC 10.3  HGB 9.5*  HCT 29.1*  PLT 137*   ------------------------------------------------------------------------------------------------------------------  Chemistries   Recent Labs Lab 05/15/17 0338 05/16/17 0617  NA 150* 141  K 3.8 3.9  CL 118* 110  CO2 22 21*  GLUCOSE 226* 212*  BUN 84* 100*  CREATININE 2.80* 3.26*  CALCIUM 11.7* 11.6*  MG 1.8  --   AST  --  112*  ALT  --  74*  ALKPHOS  --  206*  BILITOT  --  0.9   ------------------------------------------------------------------------------------------------------------------  Cardiac Enzymes  Recent Labs Lab 04/17/2017 0450  TROPONINI 0.15*    ------------------------------------------------------------------------------------------------------------------  RADIOLOGY:  Dg Chest Port 1 View  Result Date: 05/16/2017 CLINICAL DATA:  Respiratory failure. EXAM: PORTABLE CHEST 1 VIEW COMPARISON:  05/15/2017. FINDINGS: Endotracheal tube and NG tube in stable position. Heart size normal. Previously identified nodular infiltrates are again noted. Slight interim improvement. No pleural effusion or pneumothorax. IMPRESSION: 1. Lines and tubes in stable position. 2. Previously identified nodular infiltrates are again noted. Slight interim improvement . Electronically Signed   By: Marcello Moores  Register   On: 05/16/2017 06:31    EKG:   Orders placed or performed in visit on 01/03/12  . EKG 12-Lead    ASSESSMENT AND PLAN:    66 year old female with advanced dementia, left-sided hemiparesis from previous CVA who presents with acute hypoxic respiratory failure and acute encephalopathy.  1. Acute hypoxic respiratory failure with oxygen saturations in the 60s requiring intubation in the setting of left lower lobe pneumonia/HCAP, continue IV ceftriaxone Vent management by intensivist Patient's CODE STATUS was changed to DO NOT RESUSCITATE,Daughter will consider changing the patient to comfort care if no clinical improvement  I have discussed case with Dr. Jamal Collin  2. Sepsis due to HCAP with hypotension: Patient presents with tachypnea, tachycardia and hypotension Current antibiotics per intensivist Follow up on blood cultures no growth so far Start pressors if needed to keep MAP>70   3. Severe hypernatremia: Secondary to volume depletion, improved She has received 4 L normal saline in the emergency department currently on D5W Sodium now stable  4. Hypercalcemia: Could be related to dehydration and paraneoplastic  Treated with calcitonin   Calcium 14.4-11.7 -11.6   5. Acute kidney injury in the setting of sepsis and acute hypoxic  respiratory failure Continue fluids and monitor BMP  6. History of CVA  with left-sided hemiparesis  7. 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    CODE STATUS: DNR  TOTAL TIME TAKING CARE OF THIS PATIENT: 26 minutes.   POSSIBLE D/C IN 2 DAYS, DEPENDING ON CLINICAL CONDITION.  Note: This dictation was prepared with Dragon dictation along with smaller phrase technology. Any transcriptional errors that result from this process are unintentional.   Dustin Flock M.D on 05/17/2017 at 2:39 PM  Between 7am to 6pm - Pager - [phone] After 6pm go to [email or web address] - password [email or web address] [place] [facility]  Office  [phone]  CC: Primary care physician; Patient, No Pcp Per

## 2017-05-17 NOTE — Progress Notes (Signed)
Pharmacy Antibiotic Note  Ana Nielsen is a 66 y.o. female with a h/o CVA admitted on 05/13/2017 with acute respiratory failure.  Pharmacy has been consulted for vancomycin dosing. Patient is in ARF with unknown baseline SCR.   Plan: Ke= 0.0172 h -1, T1/2= 40 h  Vancomycin 750 mg iv given x 1 8/1 after random level of 19 mcg/ml to achieve a Cmax of around 34 mcg/ml. Will check a random vancomycin level in AM.   Height: 5\' 4"  (162.6 cm) Weight: 137 lb 9.1 oz (62.4 kg) IBW/kg (Calculated) : 54.7  Temp (24hrs), Avg:98.2 F (36.8 C), Min:97.9 F (36.6 C), Max:98.6 F (37 C)   Recent Labs Lab 04/22/2017 0450 04/23/2017 0538 05/13/2017 0901 05/15/17 0338 05/16/17 0617  WBC 12.3*  --   --  6.5 10.3  CREATININE 2.41*  --   --  2.80* 3.26*  LATICACIDVEN  --  3.2* 1.6  --   --   VANCORANDOM  --   --   --  30 19    Estimated Creatinine Clearance: 14.5 mL/min (A) (by C-G formula based on SCr of 3.26 mg/dL (H)).    No Known Allergies  Antimicrobials this admission: Zosyn 7/30 x 1 vancomycin 7/30 >>  Ceftazidime 7/30 >> 8/1 CTX 8/2 >>  Dose adjustments this admission:  Microbiology results: 7/30 BCx: GPC (methicillin-resistant staph species per BCID) in 1/2 7/30 Sputum: MRSA and Klebsiella pneumoniae  7/30 MRSA PCR: positive  Thank you for allowing pharmacy to be a part of this patient's care.  Ulice Dash D 05/17/2017 11:41 AM

## 2017-05-17 NOTE — Progress Notes (Signed)
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 Intubated in ED for same. She received 4 L NS for hypotension  MAJOR EVENTS/TEST RESULTS: 07/30 admitted as above 07/31 remains oligo-anuric. Remains minimally responsive. 08/01 minimally responsive. Urine output meager 08/02 remains minimally responsive. Appears uncomfortable when fentanyl infusion reduced. Tolerates PSV at 10 cm H2O  INDWELLING DEVICES:: ETT 07/30 >>   MICRO DATA: MRSA PCR 07/30 >> positive Resp 07/30 >> abundant MRSA, few klebsiella  Blood 07/30 >> 1/2 coag neg staph   ANTIMICROBIALS:  Vancomycin 07/30 >>  Pip-tazo 07/30 >> 07/31 Ceftaz 07/31 >> 08/02 Ceftriaxone 08/02 >>    SUBJECTIVE:  RASS -4. Synchronous. Minimal spontaneous movement. No withdrawal.  VITAL SIGNS: BP (!) 96/57   Pulse (!) 105   Temp 98.4 F (36.9 C)   Resp 10   Ht 5\' 4"  (1.626 m)   Wt 137 lb 9.1 oz (62.4 kg)   SpO2 94%   BMI 23.61 kg/m   HEMODYNAMICS:    VENTILATOR SETTINGS: Vent Mode: Spontaneous FiO2 (%):  [30 %-40 %] 30 % Set Rate:  [16 bmp] 16 bmp Vt Set:  [400 mL] 400 mL PEEP:  [5 cmH20] 5 cmH20 Pressure Support:  [10 cmH20] 10 cmH20 Plateau Pressure:  [15 cmH20-16 cmH20] 15 cmH20  INTAKE / OUTPUT: I/O last 3 completed shifts: In: 3961.2 [I.V.:1165.8; NG/GT:2645.3; IV Piggyback:150] Out: 570 [Urine:525; Emesis/NG output:45]  PHYSICAL EXAMINATION: General: RASS -4, not F/C Neuro: PERRLA, LUE contractures, bilateral foot drop, minimal spontaneous movement HEENT: NCAT, sclerae white, mild left proptosis Cardiovascular: Regular, no murmurs Chest/Lungs: no wheezes, few rhonchi Abdomen: Soft, no palpable masses, diminished bowel  sounds Extremities: no edema, multiple ecchymoses Skin:  Sacral decubitus pressure wound  LABS:  BMET  Recent Labs Lab 05/01/2017 0450 05/15/17 0338 05/16/17 0617  NA 160* 150* 141  K 4.0 3.8 3.9  CL 117* 118* 110  CO2 29 22 21*  BUN 90* 84* 100*  CREATININE 2.41* 2.80* 3.26*  GLUCOSE 156* 226* 212*    Electrolytes  Recent Labs Lab 04/28/2017 0450 05/15/17 0338 05/16/17 0617  CALCIUM 14.4* 11.7* 11.6*  MG  --  1.8  --   PHOS  --  5.0*  --     CBC  Recent Labs Lab 05/12/2017 0450 05/15/17 0338 05/16/17 0617  WBC 12.3* 6.5 10.3  HGB 12.2 10.3* 9.5*  HCT 39.0 32.6* 29.1*  PLT 205 119* 137*    Coag's No results for input(s): APTT, INR in the last 168 hours.  Sepsis Markers  Recent Labs Lab 05/12/2017 0538 04/17/2017 0901  LATICACIDVEN 3.2* 1.6    ABG  Recent Labs Lab 05/13/2017 0510 04/23/2017 0655  PHART 7.33* 7.33*  PCO2ART 48 40  PO2ART 70* 83    Liver Enzymes  Recent Labs Lab 05/12/2017 0450 05/16/17 0617  AST 99* 112*  ALT 30 74*  ALKPHOS 289* 206*  BILITOT 0.9 0.9  ALBUMIN 3.3* 2.3*    Cardiac Enzymes  Recent Labs Lab 04/22/2017 0450  TROPONINI 0.15*    Glucose  Recent Labs Lab 05/15/17 1214 05/15/17 1741 05/15/17 2000 05/16/17 0018 05/16/17 0406 05/16/17 0718  GLUCAP 186* 173* 172* 171* 190* 214*    CXR: NNF   ASSESSMENT / PLAN:  PULMONARY A: Acute hypoxemic respiratory failure LLL HCAP P:   Cont vent support - settings reviewed and/or adjusted PSV as tolerated Cont vent bundle Daily SBT if/when meets criteria  CARDIOVASCULAR A:  Septic shock, resolved DNR P:  Continue monitoring  RENAL A:   AKI, Oligo-anuric Volume depletion, treated Severe hypernatremia/profound dehydration, resolved 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, improving P:   Monitor BMET intermittently Monitor I/Os Correct electrolytes as indicated  GASTROINTESTINAL A:   No acute issues identified P:   SUP: Enteral  famotidine Continue TF per protocol  HEM/ONC A:   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 The son wishes that we spent some time "weaning" on PSV mode prior to extubation. He also desires that we decrease the fentanyl if possible. No family at bedside today   CCM time: 30 mins   The above time includes time spent in consultation with patient and/or family members and reviewing care plan on multidisciplinary rounds  Merton Border, MD PCCM service Mobile [phone] Pager [phone] 05/17/2017, 2:34 PM

## 2017-05-18 ENCOUNTER — Inpatient Hospital Stay: Payer: Medicare Other

## 2017-05-18 LAB — GLUCOSE, CAPILLARY
GLUCOSE-CAPILLARY: 323 mg/dL — AB (ref 65–99)
GLUCOSE-CAPILLARY: 312 mg/dL — AB (ref 65–99)
Glucose-Capillary: 356 mg/dL — ABNORMAL HIGH (ref 65–99)
Glucose-Capillary: 353 mg/dL — ABNORMAL HIGH (ref 65–99)
Glucose-Capillary: 339 mg/dL — ABNORMAL HIGH (ref 65–99)
Glucose-Capillary: 333 mg/dL — ABNORMAL HIGH (ref 65–99)
Glucose-Capillary: 323 mg/dL — ABNORMAL HIGH (ref 65–99)
Glucose-Capillary: 313 mg/dL — ABNORMAL HIGH (ref 65–99)
Glucose-Capillary: 249 mg/dL — ABNORMAL HIGH (ref 65–99)
Glucose-Capillary: 159 mg/dL — ABNORMAL HIGH (ref 65–99)

## 2017-05-18 LAB — BASIC METABOLIC PANEL
Anion gap: 10 (ref 5–15)
BUN: 105 mg/dL — AB (ref 6–20)
CHLORIDE: 109 mmol/L (ref 101–111)
CO2: 22 mmol/L (ref 22–32)
CREATININE: 2.77 mg/dL — AB (ref 0.44–1.00)
Calcium: 11.2 mg/dL — ABNORMAL HIGH (ref 8.9–10.3)
GFR calc Af Amer: 19 mL/min — ABNORMAL LOW (ref >60)
GFR calc non Af Amer: 17 mL/min — ABNORMAL LOW (ref >60)
Glucose, Bld: 318 mg/dL — ABNORMAL HIGH (ref 65–99)
Potassium: 3.8 mmol/L (ref 3.5–5.1)
SODIUM: 141 mmol/L (ref 135–145)

## 2017-05-18 LAB — VANCOMYCIN, RANDOM: VANCOMYCIN RM: 23

## 2017-05-18 MED ORDER — SODIUM CHLORIDE 0.9 % IV SOLN
INTRAVENOUS | Status: DC
  Administered 2017-05-18: 2.7 [IU]/h via INTRAVENOUS
  Filled 2017-05-18: qty 1

## 2017-05-18 MED ORDER — INSULIN DETEMIR 100 UNIT/ML ~~LOC~~ SOLN
5.0000 [IU] | Freq: Every day | SUBCUTANEOUS | Status: DC
  Administered 2017-05-18: 5 [IU] via SUBCUTANEOUS
  Filled 2017-05-18: qty 0.05

## 2017-05-18 MED ORDER — INSULIN ASPART 100 UNIT/ML ~~LOC~~ SOLN
0.0000 [IU] | SUBCUTANEOUS | Status: DC
  Administered 2017-05-18 (×2): 9 [IU] via SUBCUTANEOUS
  Filled 2017-05-18 (×2): qty 1

## 2017-05-18 MED ORDER — VANCOMYCIN HCL IN DEXTROSE 750-5 MG/150ML-% IV SOLN
750.0000 mg | INTRAVENOUS | Status: AC
  Administered 2017-05-18 – 2017-05-21 (×3): 750 mg via INTRAVENOUS
  Filled 2017-05-18 (×3): qty 150

## 2017-05-18 MED ORDER — INSULIN DETEMIR 100 UNIT/ML ~~LOC~~ SOLN
5.0000 [IU] | Freq: Every day | SUBCUTANEOUS | Status: DC
  Filled 2017-05-18: qty 0.05

## 2017-05-18 MED ORDER — SODIUM CHLORIDE 0.9 % IV SOLN
30.0000 mg | Freq: Once | INTRAVENOUS | Status: AC
  Administered 2017-05-18: 30 mg via INTRAVENOUS
  Filled 2017-05-18: qty 10

## 2017-05-18 MED ORDER — VITAL AF 1.2 CAL PO LIQD: 1000.0000 mL | ORAL | Status: DC

## 2017-05-18 MED ORDER — CALCITONIN (SALMON) 200 UNIT/ACT NA SOLN
1.0000 | Freq: Every day | NASAL | Status: DC
  Administered 2017-05-18 – 2017-05-19 (×2): 1 via NASAL
  Filled 2017-05-18: qty 3.7

## 2017-05-18 NOTE — Progress Notes (Signed)
Pharmacy Antibiotic Note  Ana Nielsen is a 66 y.o. female with a h/o CVA admitted on 05/01/2017 with acute respiratory failure.  Pharmacy has been consulted for vancomycin dosing. Patient is in ARF with renal function starting to improve.   Plan: Will order vancomycin 750 mg iv q 36 hours to target a goal trough of 15-20 mcg/ml. Will adjust dosing/check further levels as inidcated.    Height: 5\' 4"  (162.6 cm) Weight: 140 lb 14 oz (63.9 kg) IBW/kg (Calculated) : 54.7  Temp (24hrs), Avg:99 F (37.2 C), Min:98.1 F (36.7 C), Max:99.7 F (37.6 C)   Recent Labs Lab 04/24/2017 0450 04/22/2017 0538 04/30/2017 0901  05/15/17 0338 05/16/17 0617 05/18/17 0455  WBC 12.3*  --   --   --  6.5 10.3  --   CREATININE 2.41*  --   --   --  2.80* 3.26* 2.77*  LATICACIDVEN  --  3.2* 1.6  --   --   --   --   VANCORANDOM  --   --   --   < > 30 19 23   < > = values in this interval not displayed.  Estimated Creatinine Clearance: 17 mL/min (A) (by C-G formula based on SCr of 2.77 mg/dL (H)).    No Known Allergies  Antimicrobials this admission: Zosyn 7/30 x 1 vancomycin 7/30 >>  Ceftazidime 7/30 >> 8/1 CTX 8/2 >>  Dose adjustments this admission:  Microbiology results: 7/30 BCx: GPC (methicillin-resistant staph species per BCID) in 1/2 7/30 Sputum: MRSA and Klebsiella pneumoniae  7/30 MRSA PCR: positive  Thank you for allowing pharmacy to be a part of this patient's care.  Ulice Dash D 05/18/2017 1:32 PM

## 2017-05-18 NOTE — Progress Notes (Addendum)
Hinton Dyer, NP notified that patient has had 2 consecutive CBGs above 200. Orders placed for levimir and 9 units of SSI given. Per NP give the SQ insulin now and will reassess at 1600 CBG for need of insulin gtt. Patient extubated at about 1230 to 2L [place]. Fentanyl gtt continued per Dr. Alva Garnet. Patient is responsive to voice, but unable to follow commands. Patient stable without complications. Will continue to assess. Family at bedside, updated on plan of care. Wilnette Kales

## 2017-05-18 NOTE — Progress Notes (Signed)
Patient's breathing has become more labored since extubation. Ana Dyer, NP assessed patient and gave order to place patient on bipap at this time and stop feeds. Patient placed on bipap by RRT, and feeds stopped. Ana Nielsen

## 2017-05-18 NOTE — Progress Notes (Signed)
[name] NAME: Ana Nielsen    MR#:  [id number]  DATE OF BIRTH:  24-Mar-1951  SUBJECTIVE:  Patient remains on the ventilator sedated  REVIEW OF SYSTEMS:  Review of systems unobtainable as the patient is intubated  DRUG ALLERGIES:  No Known Allergies  VITALS:  Blood pressure (!) 154/85, pulse (!) 117, temperature 99.3 F (37.4 C), resp. rate 13, height 5\' 4"  (1.626 m), weight 140 lb 14 oz (63.9 kg), SpO2 100 %.  PHYSICAL EXAMINATION:  GENERAL:  66 y.o.-year-old patient lying in the bed Critically ill appearing  EYES: Pupils equal, round, reactive to light and accommodation. HEENT: Head atraumatic, normocephalic. Oropharynx With ET tube NECK:  Supple, no jugular venous distention. No thyroid enlargement, no tenderness.  LUNGS: Diminished breath sounds, intubated CARDIOVASCULAR: S1, S2 normal. No murmurs, rubs, or gallops.  ABDOMEN: Soft, nontender, nondistended.  EXTREMITIES: No pedal edema, cyanosis NEUROLOGIC: Currently intubated and has an chronic dementia  PSYCHIATRIC: Intubated  SKIN: No obvious rash, lesion, or ulcer.    LABORATORY PANEL:   CBC  Recent Labs Lab 05/16/17 0617  WBC 10.3  HGB 9.5*  HCT 29.1*  PLT 137*   ------------------------------------------------------------------------------------------------------------------  Chemistries   Recent Labs Lab 05/15/17 0338 05/16/17 0617 05/18/17 0455  NA 150* 141 141  K 3.8 3.9 3.8  CL 118* 110 109  CO2 22 21* 22  GLUCOSE 226* 212* 318*  BUN 84* 100* 105*  CREATININE 2.80* 3.26* 2.77*  CALCIUM 11.7* 11.6* 11.2*  MG 1.8  --   --   AST  --  112*  --   ALT  --  74*  --   ALKPHOS  --  206*  --   BILITOT  --  0.9  --    ------------------------------------------------------------------------------------------------------------------  Cardiac Enzymes  Recent Labs Lab 05/01/2017 0450  TROPONINI 0.15*    ------------------------------------------------------------------------------------------------------------------  RADIOLOGY:  Dg Abd 1 View  Result Date: 05/18/2017 CLINICAL DATA:  NG tube placement. EXAM: ABDOMEN - 1 VIEW COMPARISON:  No recent prior . FINDINGS: Feeding tube noted with tip projected over superior vena cava. No bowel distention. No free air. Degenerative changes thoracic spine. IMPRESSION: Feeding tube noted with tip projected over superior vena cava. Electronically Signed   By: Marcello Moores  Register   On: 05/18/2017 12:33    EKG:   Orders placed or performed in visit on 01/03/12  . EKG 12-Lead    ASSESSMENT AND PLAN:    66 year old female with advanced dementia, left-sided hemiparesis from previous CVA who presents with acute hypoxic respiratory failure and acute encephalopathy.  1. Acute hypoxic respiratory failure with Hypoxia requiring intubation in the setting of left lower lobe pneumonia/HCAP, continue IV ceftriaxone Vent management by intensivist Patient currently DO NOT RESUSCITATE  2. Sepsis due to HCAP with hypotension: Patient presents with tachypnea, tachycardia and hypotension Current antibiotics per intensivist Follow up on blood cultures no growth so far   3. Severe hypernatremia: Secondary to volume depletion, improved She has received 4 L normal saline in the emergency department currently on D5W Sodium now normal  4. Hypercalcemia: Could be related to dehydration and paraneoplastic  Treated with calcitonin  Calcium stable   5. Acute kidney injury in the setting of sepsis and acute hypoxic respiratory failure Continue fluids and monitor BMP  6. History of CVA with left-sided hemiparesis  7. 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    CODE STATUS: DNR  TOTAL TIME TAKING CARE OF THIS  PATIENT: 26 minutes.   POSSIBLE D/C IN 2 DAYS, DEPENDING ON CLINICAL CONDITION.  Note: This dictation was prepared with Dragon dictation along with smaller phrase technology. Any transcriptional errors that result from this process are unintentional.   Dustin Flock M.D on 05/18/2017 at 3:09 PM  Between 7am to 6pm - Pager - [phone] After 6pm go to [email or web address] - password [email or web address] [facility]  Office  [phone]  CC: Primary care physician; Patient, No Pcp Per

## 2017-05-18 NOTE — Progress Notes (Signed)
Nutrition Follow-up  DOCUMENTATION CODES:   Not applicable  INTERVENTION:  Continue Vital AF 1.2 at 60 ml/hr. Provides 1728 kcal, 108 grams of protein, 1166 ml H2O.  Continue free water flush of 100 ml Q8hrs. Provides total of 1466 ml H2O including water in tube feeding. Patient may benefit from more fluid (RD estimated pt to need between 1.9-2.2 L fluid/day).  Will continue to monitor discussions regarding goals of care.  NUTRITION DIAGNOSIS:   Inadequate oral intake related to inability to eat as evidenced by NPO status.  Ongoing.   GOAL:   Provide needs based on ASPEN/SCCM guidelines  Met.  MONITOR:   Vent status, Labs, Weight trends, TF tolerance, I & O's  REASON FOR ASSESSMENT:   Ventilator    ASSESSMENT:   65 yo female with PMH of depression, HTN, Stroke, Vascular dementia, transferred from [facility] on CPAP, now intubated  -Patient extubated today at approximately 1230 to 2L [place]. Per RN progress note pt is responsive to voice but unable to follow commands. -Per MD note patient with brain tumor that is possibly malignant. Apparent bony metastases on CT head.  Patient discussed in rounds. Patient had minimal responsiveness on ventilator. Low UOP. Plan is for extubation today with no plans to re-intubate. Plan is to continue nutritional support, antibiotics, and IV fluids at this time. OGT will be removed and NGT will be placed.  Re-estimated needs following extubation. Same goal TF regimen continues to meet estimated calorie and protein needs.  Access: NGT placed today; RN reports MD viewed abdominal x-ray and tube is okay to use; per abdominal x-ray tip projected over superior vena cava  TF: pt tolerating Vital AF 1.2 Cal at 60 ml/hr. Provides 1728 kcal, 108 grams of protein, 1166 ml H2O.  Medications reviewed and include: famotidine, free water flush of 100 ml Q8hrs, Novolog 0-9 units Q4hrs, Levemir 5 units QHS, ceftriaxone, fentanyl gtt,  vancomycin.  Labs reviewed: CBG 353-356 today, BUN 105, Creatinine 2.77, Calcium 11.2.  I/O 0700 8/2 to 0700 8/3: 850 ml UOP (0.6 ml/kg/hr - inadequate); one unmeasured stool output  Weight trend: 63.9 kg on 8/3 (-5.1 kg from weight on 7/30)  Discussed with RN.  Diet Order:     Skin:  Wound (see comment) (Stg I to sacrum, MSAD to left anterior arm and hand)  Last BM:  05/18/2017 - type 6  Height:   Ht Readings from Last 1 Encounters:  05/02/2017 5' 4" (1.626 m)    Weight:   Wt Readings from Last 1 Encounters:  05/18/17 140 lb 14 oz (63.9 kg)    Ideal Body Weight:  54.54 kg  BMI:  Body mass index is 24.18 kg/m.  Estimated Nutritional Needs:   Kcal:  1515-1745 (MSJ x 1.3-1.5)  Protein:  95-115 grams (1.5-1.8 grams/kg)  Fluid:  1.9-2.2 L/day (30-35 ml/kg)  EDUCATION NEEDS:   Education needs no appropriate at this time   Stephens, MS, RD, LDN Pager: [phone] After Hours Pager: [phone]  

## 2017-05-18 NOTE — Progress Notes (Addendum)
PULMONARY / CRITICAL CARE MEDICINE   Name: Ana Nielsen MRN: [id number] DOB: 11-Mar-1951    ADMISSION DATE:  04/26/2017   PT PROFILE: 81 F NH resident with severe chronic debilitation and cognitive impairment due to prior right hemispheric CVA brought to [facility] ED on morning of admission for unresponsiveness and hypoxemia. Intubated in ED for same. She received 4 L NS for hypotension  MAJOR EVENTS/TEST RESULTS: 07/30 admitted as above 07/31 remains oligo-anuric. Remains minimally responsive. 08/01 minimally responsive. Urine output meager 08/02 remains minimally responsive. Appears uncomfortable when fentanyl infusion reduced. Tolerates PSV at 10 cm H2O 08/03 extubated. Do not reintubate. Continue nutritional support, antibiotics, IV fluids, comfort measures as indicated 08/03 PM: Worsening hyperglycemia. Insulin infusion initiated. Increased work of breathing. BiPAP initiated. 08/04 grimacing, noncommunicative. Mild increased WOB on [place] O2.   INDWELLING DEVICES:: ETT 07/30 >> 08/03  MICRO DATA: MRSA PCR 07/30 >> positive Resp 07/30 >> abundant MRSA, few klebsiella  Blood 07/30 >> 1/2 coag neg staph   ANTIMICROBIALS:  Pip-tazo 07/30 >> 07/31 Ceftaz 07/31 >> 08/02 Vancomycin 07/30 >>  Ceftriaxone 08/02 >>    SUBJECTIVE:  Grimacing. Not following commands. Appearance of mild increased WOB  VITAL SIGNS: BP (!) 154/85   Pulse (!) 117   Temp 99.3 F (37.4 C)   Resp 13   Ht 5\' 4"  (1.626 m)   Wt 140 lb 14 oz (63.9 kg)   SpO2 100%   BMI 24.18 kg/m   HEMODYNAMICS:    VENTILATOR SETTINGS: Vent Mode: PSV FiO2 (%):  [30 %-40 %] 30 % Set Rate:  [16 bmp] 16 bmp Vt Set:  [400 mL] 400 mL PEEP:  [5 cmH20] 5 cmH20 Pressure Support:  [5 cmH20-10 cmH20] 5 cmH20  INTAKE / OUTPUT: I/O last 3 completed shifts: In: 3551.4 [I.V.:414.8; NG/GT:3086.7; IV Piggyback:50] Out: 1200 [Urine:1200]  PHYSICAL EXAMINATION: General: RASS 0, -1, not F/C Neuro: PERRLA, LUE  contractures HEENT: NCAT, sclerae white, very poor dentition Cardiovascular: Regular, no murmurs Chest/Lungs: Slightly coarse, no wheezes Abdomen: Soft, no palpable masses, bowel sounds present Extremities: no edema  LABS:  BMET  Recent Labs Lab 05/15/17 0338 05/16/17 0617 05/18/17 0455  NA 150* 141 141  K 3.8 3.9 3.8  CL 118* 110 109  CO2 22 21* 22  BUN 84* 100* 105*  CREATININE 2.80* 3.26* 2.77*  GLUCOSE 226* 212* 318*    Electrolytes  Recent Labs Lab 05/15/17 0338 05/16/17 0617 05/18/17 0455  CALCIUM 11.7* 11.6* 11.2*  MG 1.8  --   --   PHOS 5.0*  --   --     CBC  Recent Labs Lab 05/01/2017 0450 05/15/17 0338 05/16/17 0617  WBC 12.3* 6.5 10.3  HGB 12.2 10.3* 9.5*  HCT 39.0 32.6* 29.1*  PLT 205 119* 137*    Coag's No results for input(s): APTT, INR in the last 168 hours.  Sepsis Markers  Recent Labs Lab 04/27/2017 0538 04/23/2017 0901  LATICACIDVEN 3.2* 1.6    ABG  Recent Labs Lab 05/01/2017 0510 04/29/2017 0655  PHART 7.33* 7.33*  PCO2ART 48 40  PO2ART 70* 83    Liver Enzymes  Recent Labs Lab 04/17/2017 0450 05/16/17 0617  AST 99* 112*  ALT 30 74*  ALKPHOS 289* 206*  BILITOT 0.9 0.9  ALBUMIN 3.3* 2.3*    Cardiac Enzymes  Recent Labs Lab 05/12/2017 0450  TROPONINI 0.15*    Glucose  Recent Labs Lab 05/15/17 2000 05/16/17 0018 05/16/17 0406 05/16/17 0718 05/18/17 0937 05/18/17 1226  GLUCAP  172* 171* 190* 214* 356* 353*    CXR: NNF   ASSESSMENT / PLAN: Acute hypoxemic respiratory failure LLL HCAP - MRSA and Klebsiella on sputum culture Severe sepsis/septic shock, resolving AKI, Now nonoliguric - creatinine improved slightly Volume depletion, treated Severe hypernatremia/profound dehydration, treated and resolved Dysphagia   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 Continue fentanyl as needed and titrate for comfort including respiratory distress Not a candidate for vasopressors Continue nutritional support via NG tube at son's request Not presently a candidate for diagnostic evaluation of likely malignancy SCDs for DVT prophylaxis. Would avoid heparins Complete 7 days vancomycin and ceftriaxone - stop date order has been placed Continue calcitonin nasal spray for hypercalcemia Continue SSI Would not place back on insulin drip for any reason given poor prognosis Transfer to [place] later today if adequately comfortable  Merton Border, MD PCCM service Mobile [phone] Pager [phone] 05/19/2017 12:25 PM

## 2017-05-19 LAB — GLUCOSE, CAPILLARY
GLUCOSE-CAPILLARY: 106 mg/dL — AB (ref 65–99)
GLUCOSE-CAPILLARY: 108 mg/dL — AB (ref 65–99)
GLUCOSE-CAPILLARY: 112 mg/dL — AB (ref 65–99)
GLUCOSE-CAPILLARY: 112 mg/dL — AB (ref 65–99)
GLUCOSE-CAPILLARY: 118 mg/dL — AB (ref 65–99)
GLUCOSE-CAPILLARY: 118 mg/dL — AB (ref 65–99)
GLUCOSE-CAPILLARY: 146 mg/dL — AB (ref 65–99)
GLUCOSE-CAPILLARY: 153 mg/dL — AB (ref 65–99)
Glucose-Capillary: 101 mg/dL — ABNORMAL HIGH (ref 65–99)
Glucose-Capillary: 106 mg/dL — ABNORMAL HIGH (ref 65–99)
Glucose-Capillary: 117 mg/dL — ABNORMAL HIGH (ref 65–99)
Glucose-Capillary: 125 mg/dL — ABNORMAL HIGH (ref 65–99)

## 2017-05-19 LAB — CULTURE, BLOOD (ROUTINE X 2)
CULTURE: NO GROWTH
Special Requests: ADEQUATE

## 2017-05-19 LAB — BASIC METABOLIC PANEL
Anion gap: 8 (ref 5–15)
BUN: 110 mg/dL — AB (ref 6–20)
CHLORIDE: 112 mmol/L — AB (ref 101–111)
CO2: 26 mmol/L (ref 22–32)
CREATININE: 2.37 mg/dL — AB (ref 0.44–1.00)
Calcium: 11.5 mg/dL — ABNORMAL HIGH (ref 8.9–10.3)
GFR, EST AFRICAN AMERICAN: 23 mL/min — AB (ref >60)
GFR, EST NON AFRICAN AMERICAN: 20 mL/min — AB (ref >60)
Glucose, Bld: 119 mg/dL — ABNORMAL HIGH (ref 65–99)
POTASSIUM: 3.6 mmol/L (ref 3.5–5.1)
SODIUM: 146 mmol/L — AB (ref 135–145)

## 2017-05-19 MED ORDER — VITAL AF 1.2 CAL PO LIQD
1000.0000 mL | ORAL | Status: DC
  Administered 2017-05-19 (×2): 1000 mL

## 2017-05-19 MED ORDER — FREE WATER
200.0000 mL | Freq: Three times a day (TID) | Status: DC
  Administered 2017-05-19 – 2017-05-22 (×9): 200 mL

## 2017-05-19 MED ORDER — INSULIN GLARGINE 100 UNIT/ML ~~LOC~~ SOLN: 10.0000 [IU] | Freq: Once | SUBCUTANEOUS | Status: AC

## 2017-05-19 MED ORDER — INSULIN ASPART 100 UNIT/ML ~~LOC~~ SOLN
0.0000 [IU] | SUBCUTANEOUS | Status: DC
  Administered 2017-05-19: 2 [IU] via SUBCUTANEOUS
  Administered 2017-05-20 (×2): 3 [IU] via SUBCUTANEOUS
  Filled 2017-05-19 (×4): qty 1

## 2017-05-19 MED ORDER — DEXTROSE 10 % IV SOLN
INTRAVENOUS | Status: DC | PRN
  Administered 2017-05-19: 40 mL/h via INTRAVENOUS

## 2017-05-19 MED ORDER — INSULIN GLARGINE 100 UNIT/ML ~~LOC~~ SOLN
10.0000 [IU] | SUBCUTANEOUS | Status: DC
  Administered 2017-05-19: 10 [IU] via SUBCUTANEOUS
  Filled 2017-05-19 (×2): qty 0.1

## 2017-05-19 NOTE — Progress Notes (Signed)
[place] at [place] NAME: Ana Nielsen    MR#:  [id number]  DATE OF BIRTH:  April 04, 1951  SUBJECTIVE:  PatientIs extubated on August 3. Currently off BiPAP  REVIEW OF SYSTEMS:  Review of systems unobtainable as the patient is Disoriented  DRUG ALLERGIES:  No Known Allergies  VITALS:  Blood pressure (!) 145/74, pulse (!) 102, temperature 98.1 F (36.7 C), resp. rate (!) 21, height 5\' 4"  (1.626 m), weight 65.6 kg (144 lb 10 oz), SpO2 93 %.  PHYSICAL EXAMINATION:  GENERAL:  66 y.o.-year-old patient lying in the bed Critically ill appearing  EYES: Pupils equal, round, reactive to light and accommodation. HEENT: Head atraumatic, normocephalic.  NECK:  Supple, no jugular venous distention. No thyroid enlargement, no tenderness.  LUNGS: Diminished breath sounds,coarse, rales CARDIOVASCULAR: S1, S2 normal. No murmurs, rubs, or gallops.  ABDOMEN: Soft, nontender, nondistended.  EXTREMITIES: No pedal edema, cyanosis NEUROLOGIC:  disoriented PSYCHIATRIC: disoriented SKIN: No obvious rash, lesion, or ulcer.    LABORATORY PANEL:   CBC  Recent Labs Lab 05/16/17 0617  WBC 10.3  HGB 9.5*  HCT 29.1*  PLT 137*   ------------------------------------------------------------------------------------------------------------------  Chemistries   Recent Labs Lab 05/15/17 0338 05/16/17 0617  05/19/17 0502  NA 150* 141  < > 146*  K 3.8 3.9  < > 3.6  CL 118* 110  < > 112*  CO2 22 21*  < > 26  GLUCOSE 226* 212*  < > 119*  BUN 84* 100*  < > 110*  CREATININE 2.80* 3.26*  < > 2.37*  CALCIUM 11.7* 11.6*  < > 11.5*  MG 1.8  --   --   --   AST  --  112*  --   --   ALT  --  74*  --   --   ALKPHOS  --  206*  --   --   BILITOT  --  0.9  --   --   < > = values in this interval not displayed. ------------------------------------------------------------------------------------------------------------------  Cardiac Enzymes  Recent  Labs Lab 04/17/2017 0450  TROPONINI 0.15*   ------------------------------------------------------------------------------------------------------------------  RADIOLOGY:  Dg Abd 1 View  Result Date: 05/18/2017 CLINICAL DATA:  NG tube placement. EXAM: ABDOMEN - 1 VIEW COMPARISON:  No recent prior . FINDINGS: Feeding tube noted with tip projected over superior vena cava. No bowel distention. No free air. Degenerative changes thoracic spine. IMPRESSION: Feeding tube noted with tip projected over superior vena cava. Electronically Signed   By: Marcello Moores  Register   On: 05/18/2017 12:33    EKG:   Orders placed or performed in visit on 01/03/12  . EKG 12-Lead    ASSESSMENT AND PLAN:    66 year old female with advanced dementia, left-sided hemiparesis from previous CVA who presents with acute hypoxic respiratory failure and acute encephalopathy.  1. Acute hypoxic respiratory failure with Hypoxia requiring intubation in the setting of left lower lobe pneumonia/HCAP   IV ceftriaxone, vanc Patient is extubated on August 3 Patient currently DO NOT RESUSCITATE. Comfort care measures will be implemented after talking to the family members by intensivist  2. Sepsis due to HCAP with hypotension: Patient presents with tachypnea, tachycardia and hypotension Current antibiotics per intensivist Follow up on blood cultures no growth so far   3. Severe hypernatremia: Secondary to volume depletion, improved She has received 4 L normal saline in the emergency department currently on D5W Sodium now normal  4. Hypercalcemia: Could be related to dehydration and  paraneoplastic  Treated with calcitonin  Calcium stable   5. Acute kidney injury in the setting of sepsis and acute hypoxic respiratory failure Continue fluids prn and monitor BMP  6. History of CVA with left-sided hemiparesis  7. 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    CODE STATUS: DNR  TOTAL TIME TAKING CARE OF THIS PATIENT: 26 minutes.   POSSIBLE D/C IN 1-2 DAYS, DEPENDING ON CLINICAL CONDITION.  Note: This dictation was prepared with Dragon dictation along with smaller phrase technology. Any transcriptional errors that result from this process are unintentional.   Nicholes Mango M.D on 05/19/2017 at 10:09 AM  Between 7am to 6pm - Pager - [phone] After 6pm go to [email or web address] - password [email or web address] [facility]  Office  [phone]  CC: Primary care physician; Patient, No Pcp Per

## 2017-05-19 NOTE — Progress Notes (Signed)
Pharmacy Antibiotic Note  Ana Nielsen is a 66 y.o. female with a h/o CVA admitted on 05/06/2017 with acute respiratory failure.  Pharmacy has been consulted for vancomycin dosing. Patient is in ARF with renal function starting to improve.   Bcx: Coag neg.Staph Trach cx: MRSA, Klebsiella  Plan: Will order vancomycin 750 mg iv q 36 hours to target a goal trough of 15-20 mcg/ml. Will adjust dosing/check further levels as inidcated.   8/4:  Scr improved but PK calculations still indicate 750mg  Q36h dosing. Continue current regimen, f/u Scr.     Height: 5\' 4"  (162.6 cm) Weight: 144 lb 10 oz (65.6 kg) IBW/kg (Calculated) : 54.7  Temp (24hrs), Avg:99 F (37.2 C), Min:98.1 F (36.7 C), Max:100.2 F (37.9 C)   Recent Labs Lab 05/03/2017 0450 05/02/2017 0538 04/27/2017 0901  05/15/17 0338 05/16/17 0617 05/18/17 0455 05/19/17 0502  WBC 12.3*  --   --   --  6.5 10.3  --   --   CREATININE 2.41*  --   --   --  2.80* 3.26* 2.77* 2.37*  LATICACIDVEN  --  3.2* 1.6  --   --   --   --   --   VANCORANDOM  --   --   --   < > 30 19 23   --   < > = values in this interval not displayed.  Estimated Creatinine Clearance: 19.9 mL/min (A) (by C-G formula based on SCr of 2.37 mg/dL (H)).    No Known Allergies  Antimicrobials this admission: Zosyn 7/30 x 1 vancomycin 7/30 >>  Ceftazidime 7/30 >> 8/1 CTX 8/2 >>  Dose adjustments this admission:  Microbiology results: 7/30 BCx: GPC (methicillin-resistant staph species per BCID) in 1/2 7/30 Sputum: MRSA and Klebsiella pneumoniae  7/30 MRSA PCR: positive  Thank you for allowing pharmacy to be a part of this patient's care.  Siah Kannan A 05/19/2017 2:21 PM

## 2017-05-19 NOTE — Progress Notes (Addendum)
Pt was placed on 2lpm [place] by pt's RN after being taken off BIPAP.  We were informed by DR. Simonds that patient is being made comfort care.  BIPAP was removed from room.  RN and RT present in room when informed.

## 2017-05-19 NOTE — Progress Notes (Signed)
Patient has had 6 blood sugars below 180.  Notified M. Patria Mane, NP for conversion.
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
## 2017-05-20 DIAGNOSIS — Z7189 Other specified counseling: Secondary | ICD-10-CM

## 2017-05-20 LAB — CREATININE, SERUM
CREATININE: 2.27 mg/dL — AB (ref 0.44–1.00)
GFR calc Af Amer: 25 mL/min — ABNORMAL LOW (ref >60)
GFR, EST NON AFRICAN AMERICAN: 21 mL/min — AB (ref >60)

## 2017-05-20 LAB — GLUCOSE, CAPILLARY
GLUCOSE-CAPILLARY: 100 mg/dL — AB (ref 65–99)
GLUCOSE-CAPILLARY: 110 mg/dL — AB (ref 65–99)
GLUCOSE-CAPILLARY: 159 mg/dL — AB (ref 65–99)

## 2017-05-20 MED ORDER — VITAL AF 1.2 CAL PO LIQD
1000.0000 mL | ORAL | Status: DC
  Administered 2017-05-20 – 2017-05-22 (×3): 1000 mL

## 2017-05-20 MED ORDER — SODIUM CHLORIDE 0.9 % IV SOLN
2.0000 mg/h | INTRAVENOUS | Status: DC
  Administered 2017-05-20: 3 mg/h via INTRAVENOUS
  Administered 2017-05-22: 03:00:00 6 mg/h via INTRAVENOUS
  Filled 2017-05-20 (×2): qty 10

## 2017-05-20 MED ORDER — INSULIN ASPART 100 UNIT/ML ~~LOC~~ SOLN: 0.0000 [IU] | Freq: Four times a day (QID) | SUBCUTANEOUS | Status: DC

## 2017-05-20 MED ORDER — SCOPOLAMINE 1 MG/3DAYS TD PT72
1.0000 | MEDICATED_PATCH | TRANSDERMAL | Status: DC
  Administered 2017-05-20: 1.5 mg via TRANSDERMAL
  Filled 2017-05-20 (×2): qty 1

## 2017-05-20 NOTE — Progress Notes (Signed)
[place] at [place] NAME: Ana Nielsen    MR#:  [id number]  DATE OF BIRTH:  Aug 21, 1951  SUBJECTIVE:  Patient is not short of breath on oxygen via nasal cannula Starting comfort care measures  REVIEW OF SYSTEMS:  Review of systems unobtainable as the patient is Disoriented  DRUG ALLERGIES:  No Known Allergies  VITALS:  Blood pressure (!) 100/53, pulse (!) 113, temperature 100.2 F (37.9 C), resp. rate 12, height 5\' 4"  (1.626 m), weight 65.6 kg (144 lb 10 oz), SpO2 94 %.  PHYSICAL EXAMINATION:  GENERAL:  66 y.o.-year-old patient lying in the bed Critically ill appearing  EYES: Pupils equal, round, reactive to light and accommodation. HEENT: Head atraumatic, normocephalic.  NECK:  Supple, no jugular venous distention. No thyroid enlargement, no tenderness.  LUNGS: Diminished breath sounds,coarse, rales CARDIOVASCULAR: S1, S2 normal. No murmurs, rubs, or gallops.  ABDOMEN: Soft, nontender, nondistended.  EXTREMITIES: No pedal edema, cyanosis NEUROLOGIC:  disoriented PSYCHIATRIC: disoriented SKIN: No obvious rash, lesion, or ulcer.    LABORATORY PANEL:   CBC  Recent Labs Lab 05/16/17 0617  WBC 10.3  HGB 9.5*  HCT 29.1*  PLT 137*   ------------------------------------------------------------------------------------------------------------------  Chemistries   Recent Labs Lab 05/15/17 0338 05/16/17 0617  05/19/17 0502 05/20/17 0422  NA 150* 141  < > 146*  --   K 3.8 3.9  < > 3.6  --   CL 118* 110  < > 112*  --   CO2 22 21*  < > 26  --   GLUCOSE 226* 212*  < > 119*  --   BUN 84* 100*  < > 110*  --   CREATININE 2.80* 3.26*  < > 2.37* 2.27*  CALCIUM 11.7* 11.6*  < > 11.5*  --   MG 1.8  --   --   --   --   AST  --  112*  --   --   --   ALT  --  74*  --   --   --   ALKPHOS  --  206*  --   --   --   BILITOT  --  0.9  --   --   --   < > = values in this interval not  displayed. ------------------------------------------------------------------------------------------------------------------  Cardiac Enzymes  Recent Labs Lab 04/27/2017 0450  TROPONINI 0.15*   ------------------------------------------------------------------------------------------------------------------  RADIOLOGY:  Dg Abd 1 View  Result Date: 05/18/2017 CLINICAL DATA:  NG tube placement. EXAM: ABDOMEN - 1 VIEW COMPARISON:  No recent prior . FINDINGS: Feeding tube noted with tip projected over superior vena cava. No bowel distention. No free air. Degenerative changes thoracic spine. IMPRESSION: Feeding tube noted with tip projected over superior vena cava. Electronically Signed   By: Marcello Moores  Register   On: 05/18/2017 12:33    EKG:   Orders placed or performed in visit on 01/03/12  . EKG 12-Lead    ASSESSMENT AND PLAN:    66 year old female with advanced dementia, left-sided hemiparesis from previous CVA who presents with acute hypoxic respiratory failure and acute encephalopathy.  1. Acute hypoxic respiratory failure with Hypoxia requiring intubation in the setting of left lower lobe pneumonia/HCAP Starting comfort care from August 5 however patient's son was reluctant to stop all supportive measures. Plan is to continue nutritional support and to complete 7 days of antibiotic therapy as per intensivist discussion with the son Morphine for comfort care Oxygen for comfort as needed   IV  ceftriaxone, vanc for a total of 7 days per son's request Patient is extubated on August 3  2. Sepsis due to HCAP with hypotension: Patient presents with tachypnea, tachycardia and hypotension Current antibiotics for a total of 1 week Follow up on blood cultures no growth so far   3. Severe hypernatremia: Secondary to volume depletion, improved She has received 4 L normal saline in the emergency department currently on D5W Sodium now normal  4. Hypercalcemia: Could be related to  dehydration and paraneoplastic  Treated with calcitonin  Calcium stable   5. Acute kidney injury in the setting of sepsis and acute hypoxic respiratory failure  6. History of CVA with left-sided hemiparesis  7. 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 Follow up with palliative care  All the records are reviewed and case discussed with Care Management/Social Workerr.    CODE STATUS: DNR  TOTAL TIME TAKING CARE OF THIS PATIENT: 26 minutes.   POSSIBLE D/C IN 1-2 DAYS, DEPENDING ON CLINICAL CONDITION.  Note: This dictation was prepared with Dragon dictation along with smaller phrase technology. Any transcriptional errors that result from this process are unintentional.   Nicholes Mango M.D on 05/20/2017 at 11:56 AM  Between 7am to 6pm - Pager - [phone] After 6pm go to [email or web address] - password [email or web address] [place] [facility]  Office  [phone]  CC: Primary care physician; Patient, No Pcp Per

## 2017-05-20 NOTE — Progress Notes (Signed)
Daughter called and updated on pts current status, comfort measures continue, daughter states understanding

## 2017-05-20 NOTE — Progress Notes (Addendum)
PULMONARY / CRITICAL CARE MEDICINE   Name: Ana Nielsen MRN: [id number] DOB: 1951-02-04    ADMISSION DATE:  04/16/2017   PT PROFILE: 56 F NH resident with severe chronic debilitation and cognitive impairment due to prior right hemispheric CVA brought to [facility] ED on morning of admission for unresponsiveness and hypoxemia. Intubated in ED for same. She received 4 L NS for hypotension  MAJOR EVENTS/TEST RESULTS: 07/30 admitted as above 07/31 remains oligo-anuric. Remains minimally responsive. 08/01 minimally responsive. Urine output meager 08/02 remains minimally responsive. Appears uncomfortable when fentanyl infusion reduced. Tolerates PSV at 10 cm H2O 08/03 extubated. Do not reintubate. Continue nutritional support, antibiotics, IV fluids, comfort measures as indicated 08/03 PM: Worsening hyperglycemia. Insulin infusion initiated. Increased work of breathing. BiPAP initiated. 08/04 grimacing, noncommunicative. Mild increased WOB on [place] O2.  08/05 transfer to [place]:: ETT 07/30 >> 08/03  MICRO DATA: MRSA PCR 07/30 >> positive Resp 07/30 >> abundant MRSA, few klebsiella  Blood 07/30 >> 1/2 coag neg staph   ANTIMICROBIALS:  Pip-tazo 07/30 >> 07/31 Ceftaz 07/31 >> 08/02 Vancomycin 07/30 >> 08/06 Ceftriaxone 08/02 >> 08/06   SUBJECTIVE:  Comfortable on fentanyl infusion. RASS -4  VITAL SIGNS: BP (!) 100/53   Pulse (!) 113   Temp 100.2 F (37.9 C)   Resp 12   Ht 5\' 4"  (1.626 m)   Wt 144 lb 10 oz (65.6 kg)   SpO2 94%   BMI 24.82 kg/m   HEMODYNAMICS:    VENTILATOR SETTINGS:    INTAKE / OUTPUT: I/O last 3 completed shifts: In: 1939.9 [I.V.:407.1; NG/GT:1332.8; IV Piggyback:200] Out: 960 [Urine:960]  PHYSICAL EXAMINATION: General: RASS -4, not F/C Neuro: PERRLA, LUE contractures HEENT: NCAT, sclerae white, very poor dentition Cardiovascular: Regular, no murmurs Chest/Lungs: No adventitious sounds Abdomen: Soft, no palpable masses, bowel  sounds present Extremities: no edema  LABS:  BMET  Recent Labs Lab 05/16/17 0617 05/18/17 0455 05/19/17 0502 05/20/17 0422  NA 141 141 146*  --   K 3.9 3.8 3.6  --   CL 110 109 112*  --   CO2 21* 22 26  --   BUN 100* 105* 110*  --   CREATININE 3.26* 2.77* 2.37* 2.27*  GLUCOSE 212* 318* 119*  --     Electrolytes  Recent Labs Lab 05/15/17 0338 05/16/17 0617 05/18/17 0455 05/19/17 0502  CALCIUM 11.7* 11.6* 11.2* 11.5*  MG 1.8  --   --   --   PHOS 5.0*  --   --   --     CBC  Recent Labs Lab 05/03/2017 0450 05/15/17 0338 05/16/17 0617  WBC 12.3* 6.5 10.3  HGB 12.2 10.3* 9.5*  HCT 39.0 32.6* 29.1*  PLT 205 119* 137*    Coag's No results for input(s): APTT, INR in the last 168 hours.  Sepsis Markers  Recent Labs Lab 05/08/2017 0538 04/25/2017 0901  LATICACIDVEN 3.2* 1.6    ABG  Recent Labs Lab 05/06/2017 0510 04/15/2017 0655  PHART 7.33* 7.33*  PCO2ART 48 40  PO2ART 70* 83    Liver Enzymes  Recent Labs Lab 04/16/2017 0450 05/16/17 0617  AST 99* 112*  ALT 30 74*  ALKPHOS 289* 206*  BILITOT 0.9 0.9  ALBUMIN 3.3* 2.3*    Cardiac Enzymes  Recent Labs Lab 04/29/2017 0450  TROPONINI 0.15*    Glucose  Recent Labs Lab 05/19/17 1115 05/19/17 1554 05/19/17 1956 05/19/17 2354 05/20/17 0341 05/20/17 0746  GLUCAP 118* 125* 106* 110* 100* 159*  CXR: NNF   ASSESSMENT / PLAN: Acute hypoxemic respiratory failure LLL HCAP - MRSA and Klebsiella on sputum culture Severe sepsis/septic shock, resolved AKI, Now nonoliguric Volume depletion, treated Severe hypernatremia/profound dehydration, treated and resolved Dysphagia due to prior CVA and acute encephalopathy 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 However, her son was reluctant to stop all supportive therapies. Therefore, we have continued nutritional support and will complete 7 days of antibiotic therapy. Our main focus is her comfort and we have achieved this goal with fentanyl infusion. Prior to transfer to the [place] floor, this will be transitioned to morphine (fentanyl infusion not allowed outside of ICU/SDU). I have updated her daughter today who is satisfied with this plan. Continue supplemental oxygen. I have canceled CBGs and SSI  After transfer, PCCM will sign off. Please call if we can be of further assistance   Merton Border, MD PCCM service Mobile [phone] Pager [phone] 05/20/2017 10:39 AM

## 2017-05-21 NOTE — Progress Notes (Signed)
Palliative Medicine consult noted. Due to high referral volume, there may be a delay seeing this patient. Please call the Palliative Medicine Team office at [phone] if recommendations are needed in the interim.  Thank you for inviting [place] to see this patient.  Marjie Skiff Fleda Pagel, RN, BSN, [facility] 05/21/2017 10:00 AM Cell [phone] 8:00-4:00 Monday-Friday Office [phone]

## 2017-05-21 NOTE — Care Management Important Message (Signed)
Important Message  Patient Details  Name: Ana Nielsen MRN: [id number] Date of Birth: [birth year]   Medicare Important Message Given:  Yes    Shelbie Ammons, RN 05/21/2017, 2:13 PM

## 2017-05-21 NOTE — Progress Notes (Signed)
Nutrition Follow-up  DOCUMENTATION CODES:   Not applicable  INTERVENTION:  As patient has now transitioned to comfort care, recommend discontinuing TF and removing NGT to allow patient to be more comfortable. Noted palliative has been consulted for this reason.  Will continue to monitor discussions regarding goals of care.  NUTRITION DIAGNOSIS:   Inadequate oral intake related to inability to eat as evidenced by NPO status.  Ongoing.  GOAL:   Provide needs based on ASPEN/SCCM guidelines  Not met.  MONITOR:   Vent status, Labs, Weight trends, TF tolerance, I & O's  REASON FOR ASSESSMENT:   Ventilator    ASSESSMENT:   66 yo female with PMH of depression, HTN, Stroke, Vascular dementia, transferred from [facility] on CPAP, now intubated  -On afternoon of 8/3 TFs were held as patient was placed on BiPAP. On 8/4 patient was made comfort care. However, per chart son still wanted to continue TFs and antibiotics. Rate of tube feeds were decreased from 60 ml/hr to 40 ml/hr. -Pending new consult to PMT in setting of incomplete comfort care.  Discussed with RN. Confirmed that patient is now full comfort care. Palliative has been consulted to discuss tube feeds being discontinued.  Access: NGT placed 05/18/2017  TF: pt currently receiving Vital AF 1.2 at 40 ml/hr (decreased from goal of 60 ml/hr) Provides 1152 kcal (76% minimum estimated kcal needs), 72 grams of protein (76% minimum estimated protein needs), 778 ml H2O. Total fluid of 1378 ml H2O daily including free water flush ordered by MD.  Medications reviewed and include: 200 ml free water flush Q8hrs.  Labs reviewed: on 8/5 creatinine 2.27, CBG 159.  I/O: 600 ml UOP yesterday  Diet Order:     Skin:  Wound (see comment) (Stg I to sacrum, MSAD to left anterior arm and hand)  Last BM:  05/19/2017 - type 6  Height:   Ht Readings from Last 1 Encounters:  04/24/2017 '5\' 4"'  (1.626 m)    Weight:   Wt Readings from  Last 1 Encounters:  05/19/17 144 lb 10 oz (65.6 kg)    Ideal Body Weight:  54.54 kg  BMI:  Body mass index is 24.82 kg/m.  Estimated Nutritional Needs:   Kcal:  1515-1745 (MSJ x 1.3-1.5)  Protein:  95-115 grams (1.5-1.8 grams/kg)  Fluid:  1.9-2.2 L/day (30-35 ml/kg)  EDUCATION NEEDS:   Education needs no appropriate at this time  Willey Blade, MS, RD, LDN Pager: [phone] After Hours Pager: [phone]

## 2017-05-21 NOTE — Progress Notes (Signed)
Updated daughter on pts current status, comfort care continues

## 2017-05-21 NOTE — Clinical Social Work Note (Signed)
CSW has been consulted as pt was admitted from [facility]. Per chart review, pt is comfort care, however is receiving nutritional support. Pallitative Care is following for goals of care. CSW's assessment is pending. CSW will continue to follow.   Darden Dates, MSW, LCSW  Clinical Social Worker  [phone]

## 2017-05-21 NOTE — Progress Notes (Signed)
[place] at [place] NAME: Ana Nielsen    MR#:  [id number]  DATE OF BIRTH:  [birth year]  SUBJECTIVE:  Patient is  comfort care measures,But still on antibiotics and nutritional support as per son's request  REVIEW OF SYSTEMS:  Review of systems unobtainable as the patient is Disoriented  DRUG ALLERGIES:  No Known Allergies  VITALS:  Blood pressure 129/76, pulse (!) 115, temperature (!) 100.5 F (38.1 C), temperature source Axillary, resp. rate 14, height 5\' 4"  (1.626 m), weight 65.6 kg (144 lb 10 oz), SpO2 99 %.  PHYSICAL EXAMINATION:  GENERAL:  66 y.o.-year-old patient lying in the bed Critically ill appearing  EYES: Pupils equal, round, reactive to light and accommodation. HEENT: Head atraumatic, normocephalic.  NECK:  Supple, no jugular venous distention. No thyroid enlargement, no tenderness.  LUNGS: Diminished breath sounds,coarse rales CARDIOVASCULAR: S1, S2 normal. No murmurs, rubs, or gallops.  ABDOMEN: Soft, nontender, nondistended.  EXTREMITIES: No pedal edema, cyanosis NEUROLOGIC:  disoriented PSYCHIATRIC: disoriented SKIN: No obvious rash, lesion, or ulcer.    LABORATORY PANEL:   CBC  Recent Labs Lab 05/16/17 0617  WBC 10.3  HGB 9.5*  HCT 29.1*  PLT 137*   ------------------------------------------------------------------------------------------------------------------  Chemistries   Recent Labs Lab 05/15/17 0338 05/16/17 0617  05/19/17 0502 05/20/17 0422  NA 150* 141  < > 146*  --   K 3.8 3.9  < > 3.6  --   CL 118* 110  < > 112*  --   CO2 22 21*  < > 26  --   GLUCOSE 226* 212*  < > 119*  --   BUN 84* 100*  < > 110*  --   CREATININE 2.80* 3.26*  < > 2.37* 2.27*  CALCIUM 11.7* 11.6*  < > 11.5*  --   MG 1.8  --   --   --   --   AST  --  112*  --   --   --   ALT  --  74*  --   --   --   ALKPHOS  --  206*  --   --   --   BILITOT  --  0.9  --   --   --   < > = values in this interval not  displayed. ------------------------------------------------------------------------------------------------------------------  Cardiac Enzymes No results for input(s): TROPONINI in the last 168 hours. ------------------------------------------------------------------------------------------------------------------  RADIOLOGY:  No results found.  EKG:   Orders placed or performed in visit on 01/03/12  . EKG 12-Lead    ASSESSMENT AND PLAN:    66 year old female with advanced dementia, left-sided hemiparesis from previous CVA who presents with acute hypoxic respiratory failure and acute encephalopathy.  1. Acute hypoxic respiratory failure with Hypoxia requiring intubation in the setting of left lower lobe pneumonia/HCAP On morphine drip for comfort Palliative care consult is pending Started comfort care from August 5 however patient's son was reluctant to stop all supportive measures. Plan is to continue nutritional support and to complete 7 days of antibiotic therapy as per intensivist discussion with the son Tylenol as needed Oxygen for comfort as needed   IV ceftriaxone, vanc for a total of 7 days per son's request Patient is extubated on August 3  2. Sepsis due to HCAP with hypotension: Patient presents with tachypnea, tachycardia and hypotension Current antibiotics for a total of 1 week Follow up on blood cultures no growth so far   3. Severe hypernatremia: Secondary to volume  depletion, improved She has received 4 L normal saline in the emergency department currently on D5W Sodium now normal  4. Hypercalcemia: Could be related to dehydration and paraneoplastic  Treated with calcitonin  Calcium stable   5. Acute kidney injury in the setting of sepsis and acute hypoxic respiratory failure  6. History of CVA with left-sided hemiparesis  7. 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 Follow up with palliative care  All the records are reviewed and case discussed with Care Management/Social Workerr.    CODE STATUS: DNR  TOTAL TIME TAKING CARE OF THIS PATIENT: 26 minutes.   POSSIBLE D/C IN 1-2 DAYS, DEPENDING ON CLINICAL CONDITION.  Note: This dictation was prepared with Dragon dictation along with smaller phrase technology. Any transcriptional errors that result from this process are unintentional.   Nicholes Mango M.D on 05/21/2017 at 9:55 AM  Between 7am to 6pm - Pager - [phone] After 6pm go to [email or web address] - password [email or web address] [place]  [facility]  Office  [phone]  CC: Primary care physician; Patient, No Pcp Per
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
## 2017-05-22 DIAGNOSIS — Z515 Encounter for palliative care: Secondary | ICD-10-CM

## 2017-05-22 DIAGNOSIS — Z66 Do not resuscitate: Secondary | ICD-10-CM

## 2017-05-22 DIAGNOSIS — R627 Adult failure to thrive: Secondary | ICD-10-CM

## 2017-05-22 MED ORDER — FREE WATER
50.0000 mL | Freq: Three times a day (TID) | Status: DC
  Administered 2017-05-22 (×2): 50 mL

## 2017-05-22 MED ORDER — VITAL AF 1.2 CAL PO LIQD: 1000.0000 mL | ORAL | Status: DC

## 2017-05-22 MED ORDER — GLYCOPYRROLATE 0.2 MG/ML IJ SOLN
0.4000 mg | Freq: Three times a day (TID) | INTRAMUSCULAR | Status: DC
  Administered 2017-05-22 (×2): 0.4 mg via INTRAVENOUS
  Filled 2017-05-22 (×5): qty 2

## 2017-05-22 MED ORDER — LORAZEPAM 2 MG/ML IJ SOLN: 1.0000 mg | INTRAMUSCULAR | Status: DC | PRN

## 2017-05-22 NOTE — Progress Notes (Signed)
Chaplain received an Order Requisition for patient and family for Comfort Care/ End of Life Care. The son had left out and the patient was resting. I prayed for the patient and left out of the room. The nurse said she would call me back if the son came back.

## 2017-05-22 NOTE — Progress Notes (Signed)
[place] at [place] NAME: Ana Nielsen    MR#:  [id number]  DATE OF BIRTH:  23-Sep-1951  SUBJECTIVE:  Patient is  comfort care measures,But still on nutritional support as per son's request Daughter and son had meeting with palliative care today and agreeable to wean off nutrition  REVIEW OF SYSTEMS:  Review of systems unobtainable as the patient is Disoriented  DRUG ALLERGIES:  No Known Allergies  VITALS:  Blood pressure 125/61, pulse (!) 108, temperature 99.4 F (37.4 C), temperature source Axillary, resp. rate (!) 22, height 5\' 4"  (1.626 m), weight 65.6 kg (144 lb 10 oz), SpO2 93 %.  PHYSICAL EXAMINATION:  GENERAL:  66 y.o.-year-old patient lying in the bed Critically ill appearing  EYES: Pupils equal, round, reactive to light and accommodation. HEENT: Head atraumatic, normocephalic.  NECK:  Supple, no jugular venous distention. No thyroid enlargement, no tenderness.  LUNGS: Diminished breath sounds,coarse rales,Gasping intermittently CARDIOVASCULAR: S1, S2 normal. No murmurs, rubs, or gallops.  ABDOMEN: Soft, nontender, nondistended.  EXTREMITIES: No pedal edema, cyanosis NEUROLOGIC:  disoriented PSYCHIATRIC: disoriented SKIN: No obvious rash, lesion, or ulcer.    LABORATORY PANEL:   CBC  Recent Labs Lab 05/16/17 0617  WBC 10.3  HGB 9.5*  HCT 29.1*  PLT 137*   ------------------------------------------------------------------------------------------------------------------  Chemistries   Recent Labs Lab 05/16/17 0617  05/19/17 0502 05/20/17 0422  NA 141  < > 146*  --   K 3.9  < > 3.6  --   CL 110  < > 112*  --   CO2 21*  < > 26  --   GLUCOSE 212*  < > 119*  --   BUN 100*  < > 110*  --   CREATININE 3.26*  < > 2.37* 2.27*  CALCIUM 11.6*  < > 11.5*  --   AST 112*  --   --   --   ALT 74*  --   --   --   ALKPHOS 206*  --   --   --   BILITOT 0.9  --   --   --   < > = values in this interval not  displayed. ------------------------------------------------------------------------------------------------------------------  Cardiac Enzymes No results for input(s): TROPONINI in the last 168 hours. ------------------------------------------------------------------------------------------------------------------  RADIOLOGY:  No results found.  EKG:   Orders placed or performed in visit on 01/03/12  . EKG 12-Lead    ASSESSMENT AND PLAN:    66 year old female with advanced dementia, left-sided hemiparesis from previous CVA who presents with acute hypoxic respiratory failure and acute encephalopathy.  1. Acute hypoxic respiratory failure with Hypoxia requiring intubation in the setting of left lower lobe pneumonia/HCAP On morphine drip for comfort Palliative care consult is Following, had meeting with son and daughter today Started comfort care from August 5 , completed 7 days of antibiotics, son and daughter are agreeable to wean off nutrition today Tylenol as needed Oxygen for comfort as needed   IV ceftriaxone, vanc for a total of 7 days per son's request Patient is extubated on August 3  2. Sepsis due to HCAP with hypotension: Patient presents with tachypnea, tachycardia and hypotension Completed 1 week of antibiotic course  blood cultures with no growth   3. Severe hypernatremia: Secondary to volume depletion, improved She has received 4 L normal saline in the emergency department currently on D5W Sodium now normal  4. Hypercalcemia: Could be related to dehydration and paraneoplastic  Treated with calcitonin  Calcium stable  5. Acute kidney injury in the setting of sepsis and acute hypoxic respiratory failure  6. History of CVA with left-sided hemiparesis  7. 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 Follow up with palliative care  placement to hospice home when bed is  available   All the records are reviewed and case discussed with Care Management/Social Workerr.    CODE STATUS: DNR  TOTAL TIME TAKING CARE OF THIS PATIENT: 26 minutes.   POSSIBLE D/C IN 1-2 DAYS, DEPENDING ON CLINICAL CONDITION.  Note: This dictation was prepared with Dragon dictation along with smaller phrase technology. Any transcriptional errors that result from this process are unintentional.   Nicholes Mango M.D on 05/22/2017 at 2:51 PM  Between 7am to 6pm - Pager - [phone] After 6pm go to [email or web address] - password [email or web address] [place] [facility]  Office  [phone]  CC: Primary care physician; Patient, No Pcp Per

## 2017-05-22 NOTE — Consult Note (Signed)
                                                                                 Consultation Note Date: 05/22/2017   Patient Name: Ana Nielsen  DOB: 01/15/1951  MRN: [id number]  Age / Sex: 65 y.o., female  PCP: Patient, No Pcp Per Referring Physician: Gouru, Aruna, MD  Reason for Consultation: Establishing goals of care and Psychosocial/spiritual support  HPI/Patient Profile: 65 y.o. female   admitted on 04/20/2017 with  a known history of vascular dementia,  left-sided hemiparesis from previous stroke and depression who presents from [facility] due to acute hypoxic respiratory failure.   According to EMS patient was found with oxygen level in the 60s but it is unclear as to when patient was last seen with normal oxygen level. She was brought to the ER on a CPAP with oxygen saturations in the 70s.   She was then intubated in the emergency room for acute hypoxic respiratory failure. She was found to have severe hypernatremia as well as hypercalcemia.   CT the head is concerning for metastatic disease. Chest x-ray is concerning for possible rib fracture and left-sided pneumonia.  Currently on Morphine gtt for comfort. PMT consulted for further clarification of GOC.  Family face EOL decisions.   Clinical Assessment and Goals of Care:  This NP   reviewed medical records, received report from team, assessed the patient and then meet at the patient's bedside along with daughter/ Wendy Murrill and son/Mark Murrill to discuss diagnosis, prognosis, GOC, EOL wishes and options.  A detailed discussion was had today regarding advanced directives.  Concepts specific to code status, artifical feeding and hydration, continued IV antibiotics and rehospitalization was had.  The difference between a aggressive medical intervention path  and a palliative comfort care path for this patient at this time was had.  Values and goals of care important to patient and family were attempted to be  elicited.  Concept of  Palliative Care was discussed  Natural trajectory and expectations at EOL were discussed.  Questions and concerns addressed.   Family encouraged to call with questions or concerns.  PMT will continue to support holistically.   HCPOA- no documentation of HPOA, both children here together today acting in patient's best interest.    SUMMARY OF RECOMMENDATIONS   Both family members present today  were able to verbalize their understanding of the limited prognosis and that focus of care was comfort and dignity.      Code Status/Advance Care Planning:  DNR  Symptom Management:   Dyspnea:  Morphine gtt- initiated prior to today's meeting  Agitation: Ativan 1 mg IV every 4 hrs prn  Terminal secretions: Robinol 0.4 mg IV tid  Palliative Prophylaxis:   Frequent Pain Assessment and Oral Care  Additional Recommendations (Limitations, Scope, Preferences):  Full Comfort Care  Psycho-social/Spiritual:   Desire for further Chaplaincy support:yes  Additional Recommendations: Education on Hospice  Prognosis:   Hours - Days.    Discharge Planning: Hospice facility      Primary Diagnoses: Present on Admission: . Respiratory failure (HCC)   I have reviewed the medical record, interviewed the patient and family, and   examined the patient. The following aspects are pertinent.  Past Medical History:  Diagnosis Date  . Depression   . Hyperlipidemia   . Hypertension   . Macular degeneration   . Stroke [facility])    left-sided hemiparesis  . Vascular dementia    Social History   Social History  . Marital status: Single    Spouse name: N/A  . Number of children: N/A  . Years of education: N/A   Social History Main Topics  . Smoking status: Former Smoker    Packs/day: 0.50    Types: Cigarettes  . Smokeless tobacco: None  . Alcohol use No  . Drug use: Unknown  . Sexual activity: Not Asked   Other Topics Concern  . None   Social History  Narrative  . None   No family history on file. Scheduled Meds: . chlorhexidine gluconate (MEDLINE KIT)  15 mL Mouth Rinse BID  . free water  50 mL Per Tube Q8H  . scopolamine  1 patch Transdermal Q72H   Continuous Infusions: . feeding supplement (VITAL AF 1.2 CAL)    . morphine 6 mg/hr (05/22/17 0310)   PRN Meds:.acetaminophen **OR** [DISCONTINUED] acetaminophen, LORazepam, [DISCONTINUED] ondansetron **OR** ondansetron (ZOFRAN) IV Medications Prior to Admission:  Prior to Admission medications   Medication Sig Start Date End Date Taking? Authorizing Provider  aspirin EC 81 MG tablet Take 81 mg by mouth daily.   Yes [provider]  atorvastatin (LIPITOR) 20 MG tablet Take 20 mg by mouth daily.   Yes [provider]  carbamazepine (CARBATROL) 300 MG 12 hr capsule Take 300 mg by mouth 2 (two) times daily.   Yes [provider]  CRANBERRY-VITAMIN C-INULIN PO Take 30 mLs by mouth daily.   Yes [provider]  docusate sodium (COLACE) 100 MG capsule Take 100 mg by mouth 2 (two) times daily.   Yes [provider]  escitalopram (LEXAPRO) 20 MG tablet Take 20 mg by mouth daily.   Yes [provider]  fexofenadine (ALLEGRA ALLERGY) 60 MG tablet Take 60 mg by mouth daily as needed for allergies or rhinitis.   Yes [provider]  hydrochlorothiazide (HYDRODIURIL) 12.5 MG tablet Take 12.5 mg by mouth daily.   Yes [provider]  HYDROcodone-acetaminophen (NORCO/VICODIN) 5-325 MG tablet Take 1 tablet by mouth every 8 (eight) hours as needed for moderate pain.   Yes [provider]  magnesium hydroxide (MILK OF MAGNESIA) 400 MG/5ML suspension Take 30 mLs by mouth every 12 (twelve) hours as needed for mild constipation.   Yes [provider]  metoprolol tartrate (LOPRESSOR) 25 MG tablet Take 25 mg by mouth 2 (two) times daily.   Yes [provider]  Multiple Vitamin (MULTIVITAMIN) capsule Take 1 capsule  by mouth daily.   Yes [provider]  polyethylene glycol (MIRALAX / GLYCOLAX) packet Take 17 g by mouth every 12 (twelve) hours as needed for mild constipation, moderate constipation or severe constipation.   Yes [provider]  QUEtiapine (SEROQUEL) 100 MG tablet Take 150 mg by mouth 2 (two) times daily.   Yes [provider]  ranitidine (ZANTAC) 150 MG tablet Take 150 mg by mouth at bedtime.   Yes [provider]   No Known Allergies Review of Systems  Unable to perform ROS: Acuity of condition    Physical Exam  Constitutional: She appears cachectic. Nasal cannula in place.  HENT:  Mouth/Throat: Mucous membranes are dry. Abnormal dentition.  -audible throat secretions  Cardiovascular:  Tachycardia present.   Pulmonary/Chest: She has decreased breath sounds in the right lower field and the left lower field.  Neurological: She is unresponsive.  Skin: Skin is warm and dry.    Vital Signs: BP 125/61 (BP Location: Right Arm)   Pulse (!) 108   Temp 99.4 F (37.4 C) (Axillary)   Resp (!) 22   Ht 5' 4" (1.626 m)   Wt 65.6 kg (144 lb 10 oz)   SpO2 93%   BMI 24.82 kg/m  Pain Assessment: PAINAD POSS *See Group Information*: 2-Acceptable,Slightly drowsy, easily aroused Pain Score: Asleep   SpO2: SpO2: 93 % O2 Device:SpO2: 93 % O2 Flow Rate: .O2 Flow Rate (L/min): 3 L/min  IO: Intake/output summary:  Intake/Output Summary (Last 24 hours) at 05/22/17 1156 Last data filed at 05/22/17 0400  Gross per 24 hour  Intake             1780 ml  Output             1200 ml  Net              580 ml    LBM: Last BM Date: 05/21/17 Baseline Weight: Weight: 25.8 kg (56 lb 12.8 oz) Most recent weight: Weight: 65.6 kg (144 lb 10 oz)     Palliative Assessment/Data: 10%   Discussed with Dr Gouru  Time In: 1030 Time Out: 1145 Time Total: 75 min Greater than 50%  of this time was spent counseling and coordinating care related to the above assessment and  plan.  Signed by: , , NP   Please contact Palliative Medicine Team phone at [phone] for questions and concerns.  For individual provider: See Amion             

## 2017-05-27 DIAGNOSIS — R627 Adult failure to thrive: Secondary | ICD-10-CM

## 2017-05-27 DIAGNOSIS — A419 Sepsis, unspecified organism: Secondary | ICD-10-CM

## 2017-05-27 DIAGNOSIS — Z66 Do not resuscitate: Secondary | ICD-10-CM

## 2017-05-27 DIAGNOSIS — Z515 Encounter for palliative care: Secondary | ICD-10-CM

## 2017-06-16 NOTE — Discharge Summary (Signed)
DOA : 04/25/2017 Date of death :05-26-17   HPI :Ana Nielsen  is a 66 y.o. female with a known history of Vascular dementia left-sided hemiparesis from previous stroke and depression who presents from [facility] due to acute hypoxic respiratory failure. According to EMS patient was found with oxygen level in the 60s but it is unclear as to when patient was last seen with normal oxygen level. She was brought to the ER on a CPAP with oxygen saturations in the 70s. Patient is minimally responsive. She was moving her right arm which is her baseline. She was then intubated in the emergency room for acute hypoxic respiratory failure. She was started on Zosyn for pneumonia. She was found to have severe hypernatremia as well as hypercalcemia. She has been given 4 L of fluids along with calcitonin. CT the head is concerning for metastatic disease. Chest x-ray is concerning for possible rib fracture and left-sided pneumonia. Case discussed with intensivist.  Hospital course :  1. Acute hypoxic respiratory failure with Hypoxia requiring intubation in the setting of left lower lobe pneumonia/HCAP  2. Sepsis due to HCAPwith hypotension: Patient presents with tachypnea, tachycardia and hypotension  3. Acute kidney injury in the setting of sepsis and acute hypoxic respiratory failure  4.. History of CVA with left-sided hemiparesis  5. 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  Poor prognosis with high mortality and morbidity, pt is FTT, changed to comfort care except for 7 day antibiotics and nutrition, palliative care involved, started On morphine drip for comfort Palliative care consult is Following, had meeting with son and daughter 05/22/17 Started comfort care from August 5 , completed 7 days of antibiotics, son and daughter are agreeable to wean off nutrition om 05/22/17 Tylenol as needed Oxygen for comfort as needed   IV ceftriaxone, vanc for a total of 7 days per son's request was  completed Patient is extubated on May 22, 2023  patient was deceased on 26-May-2017

## 2017-06-16 NOTE — Progress Notes (Addendum)
On routine rounding, noted patient for being unresponsive withno visible rise and fall of chest.  Writer and a fellow RN [place] [employer] [facility] checked and verified that patient had expired. House Supervisor informed, MD [place] notified and responded. Contacted daughter  Ana Nielsen over the phone and she verbalized that her and her brother will shortly come to the hospital. Patient remained in the room.    At [place], Family arrived. Funeral home determined. Post mortem care provided.
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
## 2017-06-16 DEATH — deceased

## 2019-05-14 IMAGING — DX DG CHEST 1V PORT
1 series · 1 of 1 positions shown · non-contrast
Comparison: 05/14/2017

CLINICAL DATA: Respiratory failure.

EXAM:
PORTABLE CHEST 1 VIEW

[chest ap]
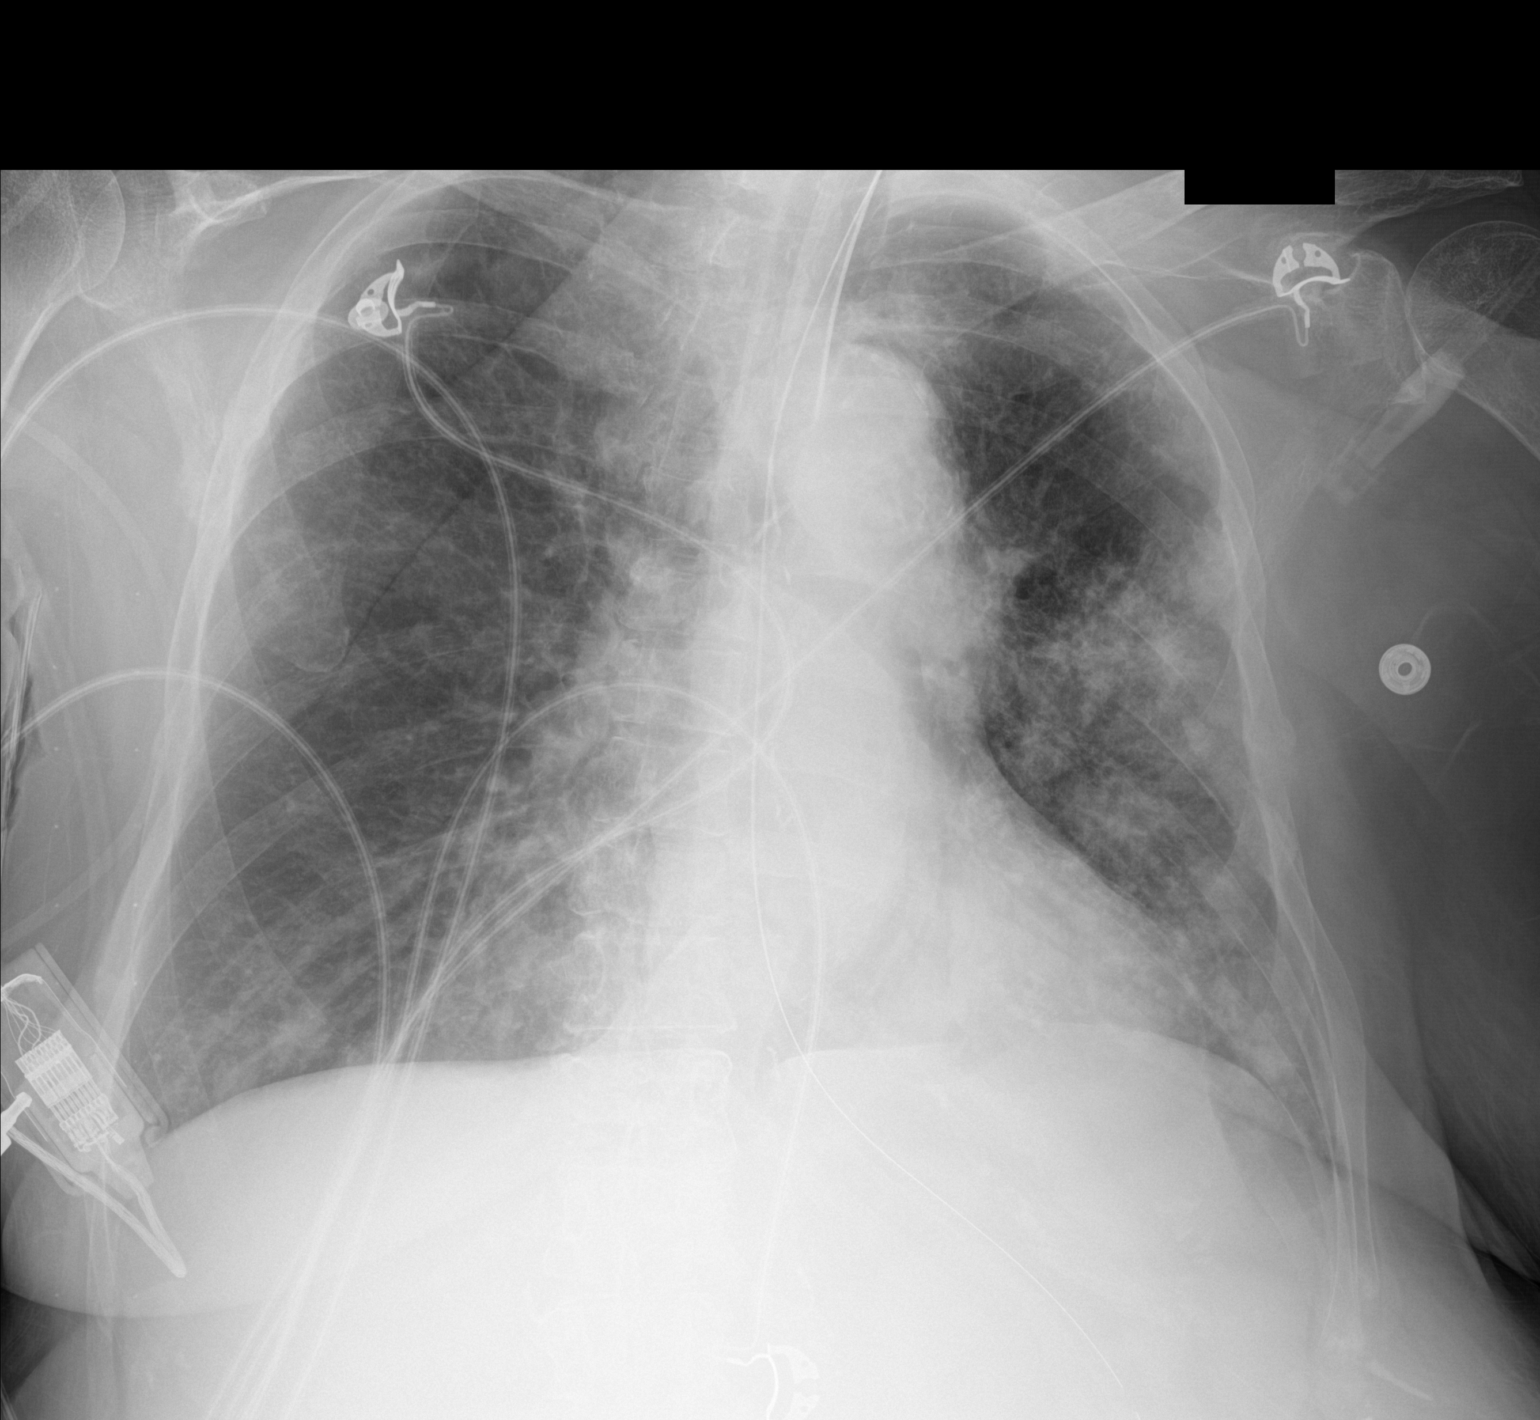

[1 of 1 positions shown; findings below may reference images not displayed]

FINDINGS: Endotracheal tube tip measures 3.5 cm above the carina. Enteric tube
tip is off the field of view but below the left hemidiaphragm.
Shallow inspiration. Heart size and pulmonary vascularity are
normal. Since the previous study, there is developing nodular
airspace infiltration in the left mid and lower lungs and in the
right lung base. This suggest developing pneumonia. No blunting of
costophrenic angles. No pneumothorax. Calcified aorta. Old bilateral
rib fractures.
IMPRESSION: Appliances appear in satisfactory position. Developing nodular
infiltrates in the left mid lung and both lower lungs. Changes
suggest developing pneumonia.
